# Patient Record
Sex: Female | Born: 1990 | Race: Black or African American | Hispanic: No | Marital: Single | Smoking: Current every day smoker
Health system: Southern US, Community
[De-identification: ages and names within clinical notes are randomized; demographics above are authoritative.]

## PROBLEM LIST (undated history)

## (undated) ENCOUNTER — Inpatient Hospital Stay (HOSPITAL_COMMUNITY): Payer: Self-pay

## (undated) DIAGNOSIS — Z789 Other specified health status: Secondary | ICD-10-CM

## (undated) DIAGNOSIS — S42309A Unspecified fracture of shaft of humerus, unspecified arm, initial encounter for closed fracture: Secondary | ICD-10-CM

## (undated) DIAGNOSIS — D649 Anemia, unspecified: Secondary | ICD-10-CM

## (undated) DIAGNOSIS — IMO0002 Reserved for concepts with insufficient information to code with codable children: Secondary | ICD-10-CM

## (undated) DIAGNOSIS — T8859XA Other complications of anesthesia, initial encounter: Secondary | ICD-10-CM

## (undated) DIAGNOSIS — R87619 Unspecified abnormal cytological findings in specimens from cervix uteri: Secondary | ICD-10-CM

## (undated) DIAGNOSIS — D219 Benign neoplasm of connective and other soft tissue, unspecified: Secondary | ICD-10-CM

## (undated) HISTORY — DX: Unspecified fracture of shaft of humerus, unspecified arm, initial encounter for closed fracture: S42.309A

## (undated) HISTORY — DX: Reserved for concepts with insufficient information to code with codable children: IMO0002

## (undated) HISTORY — DX: Unspecified abnormal cytological findings in specimens from cervix uteri: R87.619

---
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
## 2011-01-22 NOTE — Telephone Encounter (Signed)
Preadmission screen  

## 2011-01-31 ENCOUNTER — Other Ambulatory Visit: Payer: Self-pay | Admitting: Obstetrics and Gynecology

## 2011-06-09 ENCOUNTER — Encounter (HOSPITAL_COMMUNITY): Payer: Self-pay | Admitting: Emergency Medicine

## 2011-06-09 ENCOUNTER — Emergency Department (HOSPITAL_COMMUNITY)
Admission: EM | Admit: 2011-06-09 | Discharge: 2011-06-09 | Disposition: A | Discharge status: Home / Self Care | Payer: Self-pay | Attending: Emergency Medicine | Admitting: Emergency Medicine

## 2011-06-09 DIAGNOSIS — T39314A Poisoning by propionic acid derivatives, undetermined, initial encounter: Secondary | ICD-10-CM | POA: Insufficient documentation

## 2011-06-09 DIAGNOSIS — F411 Generalized anxiety disorder: Secondary | ICD-10-CM | POA: Insufficient documentation

## 2011-06-09 DIAGNOSIS — F329 Major depressive disorder, single episode, unspecified: Secondary | ICD-10-CM

## 2011-06-09 DIAGNOSIS — F3289 Other specified depressive episodes: Secondary | ICD-10-CM | POA: Insufficient documentation

## 2011-06-09 DIAGNOSIS — T394X2A Poisoning by antirheumatics, not elsewhere classified, intentional self-harm, initial encounter: Secondary | ICD-10-CM | POA: Insufficient documentation

## 2011-06-09 DIAGNOSIS — IMO0002 Reserved for concepts with insufficient information to code with codable children: Secondary | ICD-10-CM | POA: Insufficient documentation

## 2011-06-09 LAB — CBC
Platelets: 321 10*3/uL (ref 150–400)
RBC: 4.4 MIL/uL (ref 3.87–5.11)
RDW: 14.2 % (ref 11.5–15.5)
WBC: 8.6 10*3/uL (ref 4.0–10.5)

## 2011-06-09 LAB — RAPID URINE DRUG SCREEN, HOSP PERFORMED
Amphetamines: NOT DETECTED
Benzodiazepines: NOT DETECTED
Opiates: NOT DETECTED
Tetrahydrocannabinol: POSITIVE — AB

## 2011-06-09 LAB — COMPREHENSIVE METABOLIC PANEL
ALT: 20 U/L (ref 0–35)
AST: 17 U/L (ref 0–37)
Albumin: 4.2 g/dL (ref 3.5–5.2)
CO2: 27 mEq/L (ref 19–32)
Calcium: 9.6 mg/dL (ref 8.4–10.5)
Chloride: 102 mEq/L (ref 96–112)
Creatinine, Ser: 0.89 mg/dL (ref 0.50–1.10)
GFR calc non Af Amer: >90 mL/min (ref >90)
Sodium: 139 mEq/L (ref 135–145)
Total Bilirubin: 0.3 mg/dL (ref 0.3–1.2)

## 2011-06-09 LAB — SALICYLATE LEVEL: Salicylate Lvl: <2 mg/dL — ABNORMAL LOW (ref 2.8–20.0)

## 2011-06-09 LAB — ACETAMINOPHEN LEVEL: Acetaminophen (Tylenol), Serum: <15 ug/mL (ref 10–30)

## 2011-06-09 MED ORDER — ALUM & MAG HYDROXIDE-SIMETH 200-200-20 MG/5ML PO SUSP: 30.0000 mL | ORAL | Status: DC | PRN

## 2011-06-09 MED ORDER — ONDANSETRON HCL 4 MG PO TABS: 4.0000 mg | ORAL_TABLET | Freq: Three times a day (TID) | ORAL | Status: DC | PRN

## 2011-06-09 MED ORDER — IBUPROFEN 600 MG PO TABS: 600.0000 mg | ORAL_TABLET | Freq: Three times a day (TID) | ORAL | Status: DC | PRN

## 2011-06-09 MED ORDER — LORAZEPAM 1 MG PO TABS: 1.0000 mg | ORAL_TABLET | Freq: Three times a day (TID) | ORAL | Status: DC | PRN

## 2011-06-09 MED ORDER — ACETAMINOPHEN 325 MG PO TABS: 650.0000 mg | ORAL_TABLET | ORAL | Status: DC | PRN

## 2011-06-09 MED ORDER — NICOTINE 21 MG/24HR TD PT24
21.0000 mg | MEDICATED_PATCH | Freq: Every day | TRANSDERMAL | Status: DC
  Administered 2011-06-09: 21 mg via TRANSDERMAL
  Filled 2011-06-09: qty 1

## 2011-06-09 MED ORDER — ZOLPIDEM TARTRATE 5 MG PO TABS: 5.0000 mg | ORAL_TABLET | Freq: Every evening | ORAL | Status: DC | PRN

## 2011-06-09 NOTE — ED Notes (Signed)
Pt discharged home with sister. She denies SI/HI or A/VH. Discharge instructions reviewed. Verbalizes understanding.

## 2011-06-09 NOTE — ED Notes (Signed)
Per [facility], pt has been accepted for inpatient treatment. Writer informed [facility] of pt pending telepsych to determine plan of disposition. Per Elijah Birk, [facility] will hold bed but requests to be notified of telepsych results as soon as possible. CSW will notify pt's RN.

## 2011-06-09 NOTE — ED Notes (Signed)
Per EMS, pt took 15 200mg  Ibuprofen this morning.  Family called EMS.  Pt denies SI, pt states she doesn't know why she took them, she just has a lot going on and decided to take them and then shut herself in a closet.

## 2011-06-09 NOTE — ED Notes (Signed)
Pt reports taking 15 advil tablets- 200mg  around 9AM. Pt denies ETOH or drug use.
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
## 2011-06-09 NOTE — ED Notes (Signed)
Pt admitted to psych ED after overdose on ibuprofen. She states she "took a few pills" that were outdated to make herself feel numb. States she has felt upset by being a mother to a 41-month-old and having relationship problems with the baby's father. States she was not ever suicidal but admits she may benefit by supportive counseling. Denies HI or A/V H. Denies SA except for MJ. States she has smoked MJ since age 21 and smokes it about every other day. Unit policies reviewed. Pt. Verbalized understanding. Will cont. To monitor.

## 2011-06-09 NOTE — ED Notes (Signed)
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 Pt reported that she has an exam on Monday and is concerned about missing it if she goes inpatient. CSW will rely pt's concerns to MD for further consultation.

## 2011-06-09 NOTE — ED Notes (Addendum)
Telepsych report given to Dr. Jeraldine Loots. Bobby with [facility] notified of results.

## 2011-06-09 NOTE — ED Notes (Signed)
Poison control RN Deeksha called. Labs reviewed with her. Closed pt from their file.

## 2011-06-09 NOTE — Discharge Instructions (Signed)
Please be sure to take advantage of the resources provided to you by our behavioral health team.  If you develop any new, or concerning changes in your condition, please return to emergency department immediately
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
## 2011-06-09 NOTE — ED Notes (Signed)
Per Dr. Gary Fleet, the patient is appropriate for d/c w outpatient f/u.  She was provided resources and d/c in stable condition.  Gerhard Munch, MD 06/09/11 [phone]

## 2011-06-09 NOTE — ED Notes (Signed)
MD recommeds Telepsych to determine if pt can released with follow up care oppose to inpatient. RN notified.

## 2011-06-09 NOTE — ED Notes (Signed)
Poison controlled notified.  They suggested getting tylenol level and BUN/Creatinine level. They suggested the patient may have some mild GI upset.  After labs returned and if normal pt is cleared for behavioral health- Talked with [facility]

## 2012-02-13 HISTORY — DX: Maternal care for unspecified type scar from previous cesarean delivery: O34.219

## 2012-04-03 ENCOUNTER — Encounter (HOSPITAL_COMMUNITY): Payer: Self-pay

## 2012-04-03 ENCOUNTER — Inpatient Hospital Stay (HOSPITAL_COMMUNITY)
Admission: AD | Admit: 2012-04-03 | Discharge: 2012-04-03 | Disposition: A | Discharge status: Home / Self Care | Payer: Medicaid Other | Source: Ambulatory Visit | Attending: Obstetrics & Gynecology | Admitting: Obstetrics & Gynecology

## 2012-04-03 ENCOUNTER — Inpatient Hospital Stay (HOSPITAL_COMMUNITY): Payer: Medicaid Other

## 2012-04-03 DIAGNOSIS — O0932 Supervision of pregnancy with insufficient antenatal care, second trimester: Secondary | ICD-10-CM

## 2012-04-03 DIAGNOSIS — O99891 Other specified diseases and conditions complicating pregnancy: Secondary | ICD-10-CM

## 2012-04-03 DIAGNOSIS — O0930 Supervision of pregnancy with insufficient antenatal care, unspecified trimester: Secondary | ICD-10-CM

## 2012-04-03 DIAGNOSIS — O09892 Supervision of other high risk pregnancies, second trimester: Secondary | ICD-10-CM

## 2012-04-03 MED ORDER — PRENATAL PLUS 27-1 MG PO TABS: 1.0000 | ORAL_TABLET | Freq: Every day | ORAL | Status: DC
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
## 2012-04-03 NOTE — MAU Note (Signed)
Here for pregnancy verification letter only, denies pain or bleeding.

## 2012-05-29 ENCOUNTER — Other Ambulatory Visit: Payer: Self-pay

## 2012-05-29 DIAGNOSIS — Z3201 Encounter for pregnancy test, result positive: Secondary | ICD-10-CM

## 2012-05-30 LAB — OBSTETRIC PANEL
Antibody Screen: NEGATIVE
Basophils Absolute: 0 10*3/uL (ref 0.0–0.1)
Basophils Relative: 0 % (ref 0–1)
Eosinophils Absolute: 0.1 10*3/uL (ref 0.0–0.7)
Eosinophils Relative: 2 % (ref 0–5)
HCT: 30.8 % — ABNORMAL LOW (ref 36.0–46.0)
Hemoglobin: 10.5 g/dL — ABNORMAL LOW (ref 12.0–15.0)
MCH: 31.1 pg (ref 26.0–34.0)
MCHC: 34.1 g/dL (ref 30.0–36.0)
Monocytes Absolute: 0.4 10*3/uL (ref 0.1–1.0)
Monocytes Relative: 5 % (ref 3–12)
RDW: 13.7 % (ref 11.5–15.5)
Rh Type: POSITIVE

## 2012-05-30 LAB — HIV ANTIBODY (ROUTINE TESTING W REFLEX): HIV: NONREACTIVE

## 2012-06-02 LAB — HEMOGLOBINOPATHY EVALUATION: Hgb S Quant: 0 %

## 2012-06-03 ENCOUNTER — Telehealth: Payer: Self-pay | Admitting: *Deleted

## 2012-06-03 NOTE — Telephone Encounter (Addendum)
Message copied by Jill Side on Tue Jun 03, 2012 12:18 PM ------      Message from: Willodean Rosenthal      Created: Tue Jun 03, 2012  8:31 AM       Please call pt.  She needs to begin FeSO4 OTC bid and make sure she is taking her PNV.            Thx,      clh-S    ------ Called pt and mother answered. She stated that she will give Leily the message to call us back.  4/23  1210 - called pt and informed her of need for supplemental iron tablet in addition to her prenatal vitamin. Pt states that she did not receive PNV Rx. I advised that I will resend to her pharmacy. Pt voiced understanding.

## 2012-06-04 MED ORDER — FERROUS SULFATE 325 (65 FE) MG PO TABS: 325.0000 mg | ORAL_TABLET | Freq: Two times a day (BID) | ORAL | Status: DC

## 2012-06-04 MED ORDER — PRENATAL PLUS 27-1 MG PO TABS: 1.0000 | ORAL_TABLET | Freq: Every day | ORAL | Status: DC

## 2012-06-09 ENCOUNTER — Inpatient Hospital Stay (HOSPITAL_COMMUNITY): Payer: Medicaid Other

## 2012-06-09 ENCOUNTER — Inpatient Hospital Stay (HOSPITAL_COMMUNITY)
Admission: EM | Admit: 2012-06-09 | Discharge: 2012-06-09 | Disposition: A | Discharge status: Hospice | Payer: Medicaid Other | Attending: Obstetrics and Gynecology | Admitting: Obstetrics and Gynecology

## 2012-06-09 ENCOUNTER — Encounter (HOSPITAL_COMMUNITY): Payer: Self-pay | Admitting: Emergency Medicine

## 2012-06-09 DIAGNOSIS — S3991XA Unspecified injury of abdomen, initial encounter: Secondary | ICD-10-CM

## 2012-06-09 DIAGNOSIS — R109 Unspecified abdominal pain: Secondary | ICD-10-CM | POA: Insufficient documentation

## 2012-06-09 DIAGNOSIS — W19XXXA Unspecified fall, initial encounter: Secondary | ICD-10-CM

## 2012-06-09 DIAGNOSIS — O0930 Supervision of pregnancy with insufficient antenatal care, unspecified trimester: Secondary | ICD-10-CM | POA: Insufficient documentation

## 2012-06-09 DIAGNOSIS — R1084 Generalized abdominal pain: Secondary | ICD-10-CM

## 2012-06-09 DIAGNOSIS — Y92009 Unspecified place in unspecified non-institutional (private) residence as the place of occurrence of the external cause: Secondary | ICD-10-CM

## 2012-06-09 DIAGNOSIS — O4693 Antepartum hemorrhage, unspecified, third trimester: Secondary | ICD-10-CM

## 2012-06-09 DIAGNOSIS — R000 Tachycardia, unspecified: Secondary | ICD-10-CM | POA: Insufficient documentation

## 2012-06-09 DIAGNOSIS — O4690 Antepartum hemorrhage, unspecified, unspecified trimester: Secondary | ICD-10-CM | POA: Insufficient documentation

## 2012-06-09 DIAGNOSIS — Y929 Unspecified place or not applicable: Secondary | ICD-10-CM | POA: Insufficient documentation

## 2012-06-09 DIAGNOSIS — W1809XA Striking against other object with subsequent fall, initial encounter: Secondary | ICD-10-CM | POA: Insufficient documentation

## 2012-06-09 LAB — KLEIHAUER-BETKE STAIN: Quantitation Fetal Hemoglobin: 0 mL
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
## 2012-06-09 NOTE — Progress Notes (Signed)
Octavio Graves, RN at [facility] notified of pt transfer and Dr. Jolayne Panther acceptance. Report given.
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
## 2012-06-09 NOTE — Progress Notes (Signed)
Called for pt who is pregnant and fell. OB RR RN in route
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
## 2012-06-09 NOTE — ED Notes (Signed)
Rapid OB nurse at the bedside. Pt placed on fetal heart monitor
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
## 2012-06-11 NOTE — MAU Provider Note (Signed)
Attestation of Attending Supervision of Advanced Practitioner (CNM/NP): Evaluation and management procedures were performed by the Advanced Practitioner under my supervision and collaboration.  I have reviewed the Advanced Practitioner's note and chart, and I agree with the management and plan.  Hasan Douse 06/11/2012 8:51 AM   

## 2012-06-16 ENCOUNTER — Encounter: Payer: Self-pay | Admitting: Advanced Practice Midwife

## 2012-06-16 ENCOUNTER — Ambulatory Visit (INDEPENDENT_AMBULATORY_CARE_PROVIDER_SITE_OTHER): Payer: Self-pay | Admitting: Advanced Practice Midwife

## 2012-06-16 VITALS — BP 122/75 | Temp 97.0°F | Wt 194.6 lb

## 2012-06-16 DIAGNOSIS — Z915 Personal history of self-harm: Secondary | ICD-10-CM

## 2012-06-16 DIAGNOSIS — O0933 Supervision of pregnancy with insufficient antenatal care, third trimester: Secondary | ICD-10-CM

## 2012-06-16 DIAGNOSIS — T7491XA Unspecified adult maltreatment, confirmed, initial encounter: Secondary | ICD-10-CM | POA: Insufficient documentation

## 2012-06-16 DIAGNOSIS — Z8659 Personal history of other mental and behavioral disorders: Secondary | ICD-10-CM

## 2012-06-16 DIAGNOSIS — O0930 Supervision of pregnancy with insufficient antenatal care, unspecified trimester: Secondary | ICD-10-CM

## 2012-06-16 DIAGNOSIS — O34219 Maternal care for unspecified type scar from previous cesarean delivery: Secondary | ICD-10-CM

## 2012-06-16 DIAGNOSIS — F1210 Cannabis abuse, uncomplicated: Secondary | ICD-10-CM

## 2012-06-16 DIAGNOSIS — Z9151 Personal history of suicidal behavior: Secondary | ICD-10-CM

## 2012-06-16 DIAGNOSIS — R6889 Other general symptoms and signs: Secondary | ICD-10-CM

## 2012-06-16 DIAGNOSIS — IMO0002 Reserved for concepts with insufficient information to code with codable children: Secondary | ICD-10-CM | POA: Insufficient documentation

## 2012-06-16 HISTORY — DX: Supervision of pregnancy with insufficient antenatal care, unspecified trimester: O09.30

## 2012-06-16 LAB — POCT URINALYSIS DIP (DEVICE)
Glucose, UA: NEGATIVE mg/dL
Nitrite: NEGATIVE
Specific Gravity, Urine: 1.02 (ref 1.005–1.030)
Urobilinogen, UA: 1 mg/dL (ref 0.0–1.0)

## 2012-06-16 MED ORDER — PRENATAL VITAMINS 28-0.8 MG PO TABS: 1.0000 | ORAL_TABLET | Freq: Every day | ORAL | Status: DC
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
## 2012-06-16 NOTE — Progress Notes (Signed)
Nutrition note: 1st visit consult Pt has gained 21.6# @ [redacted]w[redacted]d, which is wnl. Pt reports eating 2-3 meals & 6 snacks/d. Pt is not taking PNV yet but plans to get some today. Pt reports no N&V but has some heartburn. Pt received verbal & written education on general nutrition during pregnancy. Disc tips to decrease heartburn. Disc wt gain goals of 15-25# or 0.6#/wk. Pt agrees to start taking PNV. Pt does not have WIC but plans to apply. Pt plans to BF. F/u if referred Blondell Reveal, MS, RD, LDN
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
## 2012-06-17 LAB — CULTURE, OB URINE

## 2012-06-17 LAB — WET PREP, GENITAL: Trich, Wet Prep: NONE SEEN

## 2012-06-18 ENCOUNTER — Other Ambulatory Visit: Payer: Medicaid Other

## 2012-06-18 DIAGNOSIS — O0930 Supervision of pregnancy with insufficient antenatal care, unspecified trimester: Secondary | ICD-10-CM

## 2012-06-30 ENCOUNTER — Encounter: Payer: Self-pay | Admitting: Obstetrics & Gynecology

## 2012-07-03 ENCOUNTER — Encounter: Payer: Self-pay | Admitting: *Deleted

## 2012-07-14 ENCOUNTER — Encounter: Payer: Self-pay | Admitting: Family Medicine

## 2012-07-14 ENCOUNTER — Encounter (HOSPITAL_COMMUNITY): Payer: Self-pay | Admitting: *Deleted

## 2012-07-14 ENCOUNTER — Ambulatory Visit (INDEPENDENT_AMBULATORY_CARE_PROVIDER_SITE_OTHER): Payer: Medicaid Other | Admitting: Family Medicine

## 2012-07-14 VITALS — BP 119/79 | Wt 198.1 lb

## 2012-07-14 DIAGNOSIS — O0930 Supervision of pregnancy with insufficient antenatal care, unspecified trimester: Secondary | ICD-10-CM

## 2012-07-14 DIAGNOSIS — O34219 Maternal care for unspecified type scar from previous cesarean delivery: Secondary | ICD-10-CM

## 2012-07-14 DIAGNOSIS — O0933 Supervision of pregnancy with insufficient antenatal care, third trimester: Secondary | ICD-10-CM

## 2012-07-14 LAB — POCT URINALYSIS DIP (DEVICE)
Ketones, ur: NEGATIVE mg/dL
Nitrite: NEGATIVE
Protein, ur: 30 mg/dL — AB
pH: 7 (ref 5.0–8.0)

## 2012-07-14 MED ORDER — TETANUS-DIPHTH-ACELL PERTUSSIS 5-2.5-18.5 LF-MCG/0.5 IM SUSP
0.5000 mL | Freq: Once | INTRAMUSCULAR | Status: AC
  Administered 2012-07-14: 0.5 mL via INTRAMUSCULAR

## 2012-07-14 NOTE — Progress Notes (Signed)
Desires [facility]--not yet scheduled.  Limited [facility], not here since 34 wks. Feeling pressure.

## 2012-07-14 NOTE — Progress Notes (Signed)
Pulse: 81

## 2012-07-14 NOTE — Patient Instructions (Signed)
Contraception Choices Contraception (birth control) is the use of any methods or devices to prevent pregnancy. Below are some methods to help avoid pregnancy. HORMONAL METHODS   Contraceptive implant. This is a thin, plastic tube containing progesterone hormone. It does not contain estrogen hormone. Your caregiver inserts the tube in the inner part of the upper arm. The tube can remain in place for up to 3 years. After 3 years, the implant must be removed. The implant prevents the ovaries from releasing an egg (ovulation), thickens the cervical mucus which prevents sperm from entering the uterus, and thins the lining of the inside of the uterus.  Progesterone-only injections. These injections are given every 3 months by your caregiver to prevent pregnancy. This synthetic progesterone hormone stops the ovaries from releasing eggs. It also thickens cervical mucus and changes the uterine lining. This makes it harder for sperm to survive in the uterus.  Birth control pills. These pills contain estrogen and progesterone hormone. They work by stopping the egg from forming in the ovary (ovulation). Birth control pills are prescribed by a caregiver.Birth control pills can also be used to treat heavy periods.  Minipill. This type of birth control pill contains only the progesterone hormone. They are taken every day of each month and must be prescribed by your caregiver.  Birth control patch. The patch contains hormones similar to those in birth control pills. It must be changed once a week and is prescribed by a caregiver.  Vaginal ring. The ring contains hormones similar to those in birth control pills. It is left in the vagina for 3 weeks, removed for 1 week, and then a new one is put back in place. The patient must be comfortable inserting and removing the ring from the vagina.A caregiver's prescription is necessary.  Emergency contraception. Emergency contraceptives prevent pregnancy after unprotected  sexual intercourse. This pill can be taken right after sex or up to 5 days after unprotected sex. It is most effective the sooner you take the pills after having sexual intercourse. Emergency contraceptive pills are available without a prescription. Check with your pharmacist. Do not use emergency contraception as your only form of birth control. BARRIER METHODS   Female condom. This is a thin sheath (latex or rubber) that is worn over the penis during sexual intercourse. It can be used with spermicide to increase effectiveness.  Female condom. This is a soft, loose-fitting sheath that is put into the vagina before sexual intercourse.  Diaphragm. This is a soft, latex, dome-shaped barrier that must be fitted by a caregiver. It is inserted into the vagina, along with a spermicidal jelly. It is inserted before intercourse. The diaphragm should be left in the vagina for 6 to 8 hours after intercourse.  Cervical cap. This is a round, soft, latex or plastic cup that fits over the cervix and must be fitted by a caregiver. The cap can be left in place for up to 48 hours after intercourse.  Sponge. This is a soft, circular piece of polyurethane foam. The sponge has spermicide in it. It is inserted into the vagina after wetting it and before sexual intercourse.  Spermicides. These are chemicals that kill or block sperm from entering the cervix and uterus. They come in the form of creams, jellies, suppositories, foam, or tablets. They do not require a prescription. They are inserted into the vagina with an applicator before having sexual intercourse. The process must be repeated every time you have sexual intercourse. INTRAUTERINE CONTRACEPTION  Intrauterine device (  IUD). This is a T-shaped device that is put in a woman's uterus during a menstrual period to prevent pregnancy. There are 2 types:  Copper IUD. This type of IUD is wrapped in copper wire and is placed inside the uterus. Copper makes the uterus and  fallopian tubes produce a fluid that kills sperm. It can stay in place for 10 years.  Hormone IUD. This type of IUD contains the hormone progestin (synthetic progesterone). The hormone thickens the cervical mucus and prevents sperm from entering the uterus, and it also thins the uterine lining to prevent implantation of a fertilized egg. The hormone can weaken or kill the sperm that get into the uterus. It can stay in place for 5 years. PERMANENT METHODS OF CONTRACEPTION  Female tubal ligation. This is when the woman's fallopian tubes are surgically sealed, tied, or blocked to prevent the egg from traveling to the uterus.  Female sterilization. This is when the female has the tubes that carry sperm tied off (vasectomy).This blocks sperm from entering the vagina during sexual intercourse. After the procedure, the man can still ejaculate fluid (semen). NATURAL PLANNING METHODS  Natural family planning. This is not having sexual intercourse or using a barrier method (condom, diaphragm, cervical cap) on days the woman could become pregnant.  Calendar method. This is keeping track of the length of each menstrual cycle and identifying when you are fertile.  Ovulation method. This is avoiding sexual intercourse during ovulation.  Symptothermal method. This is avoiding sexual intercourse during ovulation, using a thermometer and ovulation symptoms.  Post-ovulation method. This is timing sexual intercourse after you have ovulated. Regardless of which type or method of contraception you choose, it is important that you use condoms to protect against the transmission of sexually transmitted diseases (STDs). Talk with your caregiver about which form of contraception is most appropriate for you. Document Released: 01/29/2005 Document Revised: 04/23/2011 Document Reviewed: 06/07/2010 [employer] Patient Information 2014 [employer].  Breastfeeding A change in hormones during your pregnancy causes growth of  your breast tissue and an increase in number and size of milk ducts. The hormone prolactin allows proteins, sugars, and fats from your blood supply to make breast milk in your milk-producing glands. The hormone progesterone prevents breast milk from being released before the birth of your baby. After the birth of your baby, your progesterone level decreases allowing breast milk to be released. Thoughts of your baby, as well as his or her sucking or crying, can stimulate the release of milk from the milk-producing glands. Deciding to breastfeed (nurse) is one of the best choices you can make for you and your baby. The information that follows gives a brief review of the benefits, as well as other important skills to know about breastfeeding. BENEFITS OF BREASTFEEDING For your baby  The first milk (colostrum) helps your baby's digestive system function better.   There are antibodies in your milk that help your baby fight off infections.   Your baby has a lower incidence of asthma, allergies, and sudden infant death syndrome (SIDS).   The nutrients in breast milk are better for your baby than infant formulas.  Breast milk improves your baby's brain development.   Your baby will have less gas, colic, and constipation.  Your baby is less likely to develop other conditions, such as childhood obesity, asthma, or diabetes mellitus. For you  Breastfeeding helps develop a very special bond between you and your baby.   Breastfeeding is convenient, always available at the correct   temperature, and costs nothing.   Breastfeeding helps to burn calories and helps you lose the weight gained during pregnancy.   Breastfeeding makes your uterus contract back down to normal size faster and slows bleeding following delivery.   Breastfeeding mothers have a lower risk of developing osteoporosis or breast or ovarian cancer later in life.  BREASTFEEDING FREQUENCY  A healthy, full-term baby may  breastfeed as often as every hour or space his or her feedings to every 3 hours. Breastfeeding frequency will vary from baby to baby.   Newborns should be fed no less than every 2 3 hours during the day and every 4 5 hours during the night. You should breastfeed a minimum of 8 feedings in a 24 hour period.  Awaken your baby to breastfeed if it has been 3 4 hours since the last feeding.  Breastfeed when you feel the need to reduce the fullness of your breasts or when your newborn shows signs of hunger. Signs that your baby may be hungry include:  Increased alertness or activity.  Stretching.  Movement of the head from side to side.  Movement of the head and opening of the mouth when the corner of the mouth or cheek is stroked (rooting).  Increased sucking sounds, smacking lips, cooing, sighing, or squeaking.  Hand-to-mouth movements.  Increased sucking of fingers or hands.  Fussing.  Intermittent crying.  Signs of extreme hunger will require calming and consoling before you try to feed your baby. Signs of extreme hunger may include:  Restlessness.  A loud, strong cry.  Screaming.  Frequent feeding will help you make more milk and will help prevent problems, such as sore nipples and engorgement of the breasts.  BREASTFEEDING   Whether lying down or sitting, be sure that the baby's abdomen is facing your abdomen.   Support your breast with 4 fingers under your breast and your thumb above your nipple. Make sure your fingers are well away from your nipple and your baby's mouth.   Stroke your baby's lips gently with your finger or nipple.   When your baby's mouth is open wide enough, place all of your nipple and as much of the colored area around your nipple (areola) as possible into your baby's mouth.  More areola should be visible above his or her upper lip than below his or her lower lip.  Your baby's tongue should be between his or her lower gum and your  breast.  Ensure that your baby's mouth is correctly positioned around the nipple (latched). Your baby's lips should create a seal on your breast.  Signs that your baby has effectively latched onto your nipple include:  Tugging or sucking without pain.  Swallowing heard between sucks.  Absent click or smacking sound.  Muscle movement above and in front of his or her ears with sucking.  Your baby must suck about 2 3 minutes in order to get your milk. Allow your baby to feed on each breast as long as he or she wants. Nurse your baby until he or she unlatches or falls asleep at the first breast, then offer the second breast.  Signs that your baby is full and satisfied include:  A gradual decrease in the number of sucks or complete cessation of sucking.  Falling asleep.  Extension or relaxation of his or her body.  Retention of a small amount of milk in his or her mouth.  Letting go of your breast by himself or herself.  Signs of effective   breastfeeding in you include:  Breasts that have increased firmness, weight, and size prior to feeding.  Breasts that are softer after nursing.  Increased milk volume, as well as a change in milk consistency and color by the 5th day of breastfeeding.  Breast fullness relieved by breastfeeding.  Nipples are not sore, cracked, or bleeding.  If needed, break the suction by putting your finger into the corner of your baby's mouth and sliding your finger between his or her gums. Then, remove your breast from his or her mouth.  It is common for babies to spit up a small amount after a feeding.  Babies often swallow air during feeding. This can make babies fussy. Burping your baby between breasts can help with this.  Vitamin D supplements are recommended for babies who get only breast milk.  Avoid using a pacifier during your baby's first 4 6 weeks.  Avoid supplemental feedings of water, formula, or juice in place of breastfeeding. Breast milk  is all the food your baby needs. It is not necessary for your baby to have water or formula. Your breasts will make more milk if supplemental feedings are avoided during the early weeks. HOW TO TELL WHETHER YOUR BABY IS GETTING ENOUGH BREAST MILK Wondering whether or not your baby is getting enough milk is a common concern among mothers. You can be assured that your baby is getting enough milk if:   Your baby is actively sucking and you hear swallowing.   Your baby seems relaxed and satisfied after a feeding.   Your baby nurses at least 8 12 times in a 24 hour time period.  During the first 3 5 days of age:  Your baby is wetting at least 3 5 diapers in a 24 hour period. The urine should be clear and pale yellow.  Your baby is having at least 3 4 stools in a 24 hour period. The stool should be soft and yellow.  At 5 7 days of age, your baby is having at least 3 6 stools in a 24 hour period. The stool should be seedy and yellow by 5 days of age.  Your baby has a weight loss less than 7 10% during the first 3 days of age.  Your baby does not lose weight after 3 7 days of age.  Your baby gains 4 7 ounces each week after he or she is 4 days of age.  Your baby gains weight by 5 days of age and is back to birth weight within 2 weeks. ENGORGEMENT In the first week after your baby is born, you may experience extremely full breasts (engorgement). When engorged, your breasts may feel heavy, warm, or tender to the touch. Engorgement peaks within 24 48 hours after delivery of your baby.  Engorgement may be reduced by:  Continuing to breastfeed.  Increasing the frequency of breastfeeding.  Taking warm showers or applying warm, moist heat to your breasts just before each feeding. This increases circulation and helps the milk flow.   Gently massaging your breast before and during the feedings. With your fingertips, massage from your chest wall towards your nipple in a circular motion.    Ensuring that your baby empties at least one breast at every feeding. It also helps to start the next feeding on the opposite breast.   Expressing breast milk by hand or by using a breast pump to empty the breasts if your baby is sleepy, or not nursing well. You may also want to   express milk if you are returning to work oryou feel you are getting engorged.  Ensuring your baby is latched on and positioned properly while breastfeeding. If you follow these suggestions, your engorgement should improve in 24 48 hours. If you are still experiencing difficulty, call your lactation consultant or caregiver.  CARING FOR YOURSELF Take care of your breasts.  Bathe or shower daily.   Avoid using soap on your nipples.   Wear a supportive bra. Avoid wearing underwire style bras.  Air dry your nipples for a 3 4minutes after each feeding.   Use only cotton bra pads to absorb breast milk leakage. Leaking of breast milk between feedings is normal.   Use only pure lanolin on your nipples after nursing. You do not need to wash it off before feeding your baby again. Another option is to express a few drops of breast milk and gently massage that milk into your nipples.  Continue breast self-awareness checks. Take care of yourself.  Eat healthy foods. Alternate 3 meals with 3 snacks.  Avoid foods that you notice affect your baby in a bad way.  Drink milk, fruit juice, and water to satisfy your thirst (about 8 glasses a day).   Rest often, relax, and take your prenatal vitamins to prevent fatigue, stress, and anemia.  Avoid chewing and smoking tobacco.  Avoid alcohol and drug use.  Take over-the-counter and prescribed medicine only as directed by your caregiver or pharmacist. You should always check with your caregiver or pharmacist before taking any new medicine, vitamin, or herbal supplement.  Know that pregnancy is possible while breastfeeding. If desired, talk to your caregiver about  family planning and safe birth control methods that may be used while breastfeeding. SEEK MEDICAL CARE IF:   You feel like you want to stop breastfeeding or have become frustrated with breastfeeding.  You have painful breasts or nipples.  Your nipples are cracked or bleeding.  Your breasts are red, tender, or warm.  You have a swollen area on either breast.  You have a fever or chills.  You have nausea or vomiting.  You have drainage from your nipples.  Your breasts do not become full before feedings by the 5th day after delivery.  You feel sad and depressed.  Your baby is too sleepy to eat well.  Your baby is having trouble sleeping.   Your baby is wetting less than 3 diapers in a 24 hour period.  Your baby has less than 3 stools in a 24 hour period.  Your baby's skin or the white part of his or her eyes becomes more yellow.   Your baby is not gaining weight by 5 days of age. MAKE SURE YOU:   Understand these instructions.  Will watch your condition.  Will get help right away if you are not doing well or get worse. Document Released: 01/29/2005 Document Revised: 10/24/2011 Document Reviewed: 09/05/2011 [employer] Patient Information 2014 [employer].  

## 2012-07-15 ENCOUNTER — Encounter (HOSPITAL_COMMUNITY): Payer: Self-pay | Admitting: Anesthesiology

## 2012-07-15 ENCOUNTER — Inpatient Hospital Stay (HOSPITAL_COMMUNITY): Payer: Medicaid Other | Admitting: Anesthesiology

## 2012-07-15 ENCOUNTER — Encounter (HOSPITAL_COMMUNITY)
Admission: RE | Disposition: A | Discharge status: Home / Self Care | Payer: Self-pay | Source: Ambulatory Visit | Attending: Obstetrics & Gynecology

## 2012-07-15 ENCOUNTER — Inpatient Hospital Stay (HOSPITAL_COMMUNITY)
Admission: RE | Admit: 2012-07-15 | Discharge: 2012-07-17 | DRG: 766 | Disposition: A | Discharge status: Home / Self Care | Payer: Medicaid Other | Source: Ambulatory Visit | Attending: Obstetrics & Gynecology | Admitting: Obstetrics & Gynecology

## 2012-07-15 DIAGNOSIS — IMO0002 Reserved for concepts with insufficient information to code with codable children: Secondary | ICD-10-CM

## 2012-07-15 DIAGNOSIS — O0933 Supervision of pregnancy with insufficient antenatal care, third trimester: Secondary | ICD-10-CM

## 2012-07-15 DIAGNOSIS — O34219 Maternal care for unspecified type scar from previous cesarean delivery: Principal | ICD-10-CM | POA: Diagnosis present

## 2012-07-15 HISTORY — DX: Anemia, unspecified: D64.9

## 2012-07-15 HISTORY — DX: Other specified health status: Z78.9

## 2012-07-15 LAB — CBC
HCT: 34 % — ABNORMAL LOW (ref 36.0–46.0)
MCV: 92.9 fL (ref 78.0–100.0)
Platelets: 164 10*3/uL (ref 150–400)
RBC: 3.66 MIL/uL — ABNORMAL LOW (ref 3.87–5.11)
RDW: 13.6 % (ref 11.5–15.5)
WBC: 9.8 10*3/uL (ref 4.0–10.5)

## 2012-07-15 LAB — DRUG SCREEN, URINE
Amphetamine Screen, Ur: NEGATIVE
Barbiturate Quant, Ur: NEGATIVE
Marijuana Metabolite: NEGATIVE
Methadone: NEGATIVE
Propoxyphene: NEGATIVE

## 2012-07-15 LAB — TYPE AND SCREEN: Antibody Screen: NEGATIVE

## 2012-07-15 SURGERY — Surgical Case
Anesthesia: Spinal | Site: Abdomen | Wound class: Clean Contaminated

## 2012-07-15 MED ORDER — KETOROLAC TROMETHAMINE 30 MG/ML IJ SOLN: 30.0000 mg | Freq: Four times a day (QID) | INTRAMUSCULAR | Status: AC | PRN

## 2012-07-15 MED ORDER — CEFAZOLIN SODIUM-DEXTROSE 2-3 GM-% IV SOLR
INTRAVENOUS | Status: AC
Start: 2012-07-15 — End: 2012-07-15
  Filled 2012-07-15: qty 50

## 2012-07-15 MED ORDER — LACTATED RINGERS IV SOLN
Freq: Once | INTRAVENOUS | Status: AC
  Administered 2012-07-15: 13:00:00 via INTRAVENOUS

## 2012-07-15 MED ORDER — BUPIVACAINE HCL (PF) 0.5 % IJ SOLN
INTRAMUSCULAR | Status: DC | PRN
  Administered 2012-07-15: 30 mL

## 2012-07-15 MED ORDER — MORPHINE SULFATE 0.5 MG/ML IJ SOLN
INTRAMUSCULAR | Status: AC
  Filled 2012-07-15: qty 10

## 2012-07-15 MED ORDER — DIPHENHYDRAMINE HCL 50 MG/ML IJ SOLN: 12.5000 mg | INTRAMUSCULAR | Status: DC | PRN

## 2012-07-15 MED ORDER — ZOLPIDEM TARTRATE 5 MG PO TABS: 5.0000 mg | ORAL_TABLET | Freq: Every evening | ORAL | Status: DC | PRN

## 2012-07-15 MED ORDER — SENNOSIDES-DOCUSATE SODIUM 8.6-50 MG PO TABS
2.0000 | ORAL_TABLET | Freq: Every evening | ORAL | Status: DC | PRN
  Administered 2012-07-16: 2 via ORAL

## 2012-07-15 MED ORDER — SIMETHICONE 80 MG PO CHEW
80.0000 mg | CHEWABLE_TABLET | Freq: Three times a day (TID) | ORAL | Status: DC
  Administered 2012-07-15 – 2012-07-17 (×6): 80 mg via ORAL

## 2012-07-15 MED ORDER — SCOPOLAMINE 1 MG/3DAYS TD PT72: 1.0000 | MEDICATED_PATCH | Freq: Once | TRANSDERMAL | Status: DC

## 2012-07-15 MED ORDER — SIMETHICONE 80 MG PO CHEW: 80.0000 mg | CHEWABLE_TABLET | ORAL | Status: DC | PRN

## 2012-07-15 MED ORDER — DIPHENHYDRAMINE HCL 25 MG PO CAPS: 25.0000 mg | ORAL_CAPSULE | ORAL | Status: DC | PRN

## 2012-07-15 MED ORDER — MEASLES, MUMPS & RUBELLA VAC ~~LOC~~ INJ
0.5000 mL | INJECTION | Freq: Once | SUBCUTANEOUS | Status: DC
  Filled 2012-07-15: qty 0.5

## 2012-07-15 MED ORDER — LACTATED RINGERS IV SOLN
INTRAVENOUS | Status: DC
  Administered 2012-07-15: 15:00:00 via INTRAVENOUS

## 2012-07-15 MED ORDER — ACETAMINOPHEN 10 MG/ML IV SOLN
1000.0000 mg | Freq: Four times a day (QID) | INTRAVENOUS | Status: AC | PRN
  Filled 2012-07-15: qty 100

## 2012-07-15 MED ORDER — DIPHENHYDRAMINE HCL 25 MG PO CAPS: 25.0000 mg | ORAL_CAPSULE | Freq: Four times a day (QID) | ORAL | Status: DC | PRN

## 2012-07-15 MED ORDER — NALOXONE HCL 1 MG/ML IJ SOLN
1.0000 ug/kg/h | INTRAMUSCULAR | Status: DC | PRN
  Filled 2012-07-15: qty 2

## 2012-07-15 MED ORDER — BUPIVACAINE IN DEXTROSE 0.75-8.25 % IT SOLN
INTRATHECAL | Status: DC | PRN
  Administered 2012-07-15: 12 mg via INTRATHECAL

## 2012-07-15 MED ORDER — ONDANSETRON HCL 4 MG/2ML IJ SOLN: 4.0000 mg | Freq: Three times a day (TID) | INTRAMUSCULAR | Status: DC | PRN

## 2012-07-15 MED ORDER — DIPHENHYDRAMINE HCL 50 MG/ML IJ SOLN: 25.0000 mg | INTRAMUSCULAR | Status: DC | PRN

## 2012-07-15 MED ORDER — DEXAMETHASONE SODIUM PHOSPHATE 10 MG/ML IJ SOLN
INTRAMUSCULAR | Status: DC | PRN
  Administered 2012-07-15: 10 mg via INTRAVENOUS

## 2012-07-15 MED ORDER — EPHEDRINE 5 MG/ML INJ
INTRAVENOUS | Status: AC
  Filled 2012-07-15: qty 10

## 2012-07-15 MED ORDER — KETOROLAC TROMETHAMINE 30 MG/ML IJ SOLN
INTRAMUSCULAR | Status: AC
  Administered 2012-07-15: 30 mg via INTRAVENOUS
  Filled 2012-07-15: qty 1

## 2012-07-15 MED ORDER — LACTATED RINGERS IV SOLN
INTRAVENOUS | Status: DC
  Administered 2012-07-15: 22:00:00 via INTRAVENOUS

## 2012-07-15 MED ORDER — ONDANSETRON HCL 4 MG/2ML IJ SOLN
INTRAMUSCULAR | Status: DC | PRN
  Administered 2012-07-15: 4 mg via INTRAVENOUS

## 2012-07-15 MED ORDER — CEFAZOLIN SODIUM-DEXTROSE 2-3 GM-% IV SOLR
INTRAVENOUS | Status: AC
  Filled 2012-07-15: qty 50

## 2012-07-15 MED ORDER — BUPIVACAINE HCL (PF) 0.5 % IJ SOLN
INTRAMUSCULAR | Status: AC
  Filled 2012-07-15: qty 30

## 2012-07-15 MED ORDER — MIDAZOLAM HCL 2 MG/2ML IJ SOLN: 0.5000 mg | Freq: Once | INTRAMUSCULAR | Status: DC | PRN

## 2012-07-15 MED ORDER — OXYCODONE-ACETAMINOPHEN 5-325 MG PO TABS
1.0000 | ORAL_TABLET | ORAL | Status: DC | PRN
  Administered 2012-07-16 (×4): 1 via ORAL
  Administered 2012-07-17 (×2): 2 via ORAL
  Filled 2012-07-15: qty 1
  Filled 2012-07-15: qty 2
  Filled 2012-07-15: qty 1
  Filled 2012-07-15: qty 2
  Filled 2012-07-15 (×2): qty 1

## 2012-07-15 MED ORDER — METOCLOPRAMIDE HCL 5 MG/ML IJ SOLN: 10.0000 mg | Freq: Three times a day (TID) | INTRAMUSCULAR | Status: DC | PRN

## 2012-07-15 MED ORDER — WITCH HAZEL-GLYCERIN EX PADS: 1.0000 "application " | MEDICATED_PAD | CUTANEOUS | Status: DC | PRN

## 2012-07-15 MED ORDER — FENTANYL CITRATE 0.05 MG/ML IJ SOLN: 25.0000 ug | INTRAMUSCULAR | Status: DC | PRN

## 2012-07-15 MED ORDER — NALBUPHINE HCL 10 MG/ML IJ SOLN
5.0000 mg | INTRAMUSCULAR | Status: DC | PRN
  Filled 2012-07-15: qty 1

## 2012-07-15 MED ORDER — LANOLIN HYDROUS EX OINT: 1.0000 "application " | TOPICAL_OINTMENT | CUTANEOUS | Status: DC | PRN

## 2012-07-15 MED ORDER — ONDANSETRON HCL 4 MG/2ML IJ SOLN: 4.0000 mg | INTRAMUSCULAR | Status: DC | PRN

## 2012-07-15 MED ORDER — MENTHOL 3 MG MT LOZG: 1.0000 | LOZENGE | OROMUCOSAL | Status: DC | PRN

## 2012-07-15 MED ORDER — NALOXONE HCL 0.4 MG/ML IJ SOLN: 0.4000 mg | INTRAMUSCULAR | Status: DC | PRN

## 2012-07-15 MED ORDER — IBUPROFEN 600 MG PO TABS
600.0000 mg | ORAL_TABLET | Freq: Four times a day (QID) | ORAL | Status: DC
  Administered 2012-07-16 – 2012-07-17 (×7): 600 mg via ORAL
  Filled 2012-07-15 (×7): qty 1

## 2012-07-15 MED ORDER — SCOPOLAMINE 1 MG/3DAYS TD PT72
MEDICATED_PATCH | TRANSDERMAL | Status: AC
  Administered 2012-07-15: 1.5 mg via TRANSDERMAL
  Filled 2012-07-15: qty 1

## 2012-07-15 MED ORDER — PRENATAL MULTIVITAMIN CH
1.0000 | ORAL_TABLET | Freq: Every day | ORAL | Status: DC
  Administered 2012-07-16 – 2012-07-17 (×2): 1 via ORAL
  Filled 2012-07-15 (×2): qty 1

## 2012-07-15 MED ORDER — FENTANYL CITRATE 0.05 MG/ML IJ SOLN
INTRAMUSCULAR | Status: DC | PRN
  Administered 2012-07-15: 25 ug via INTRATHECAL

## 2012-07-15 MED ORDER — MEPERIDINE HCL 25 MG/ML IJ SOLN: 6.2500 mg | INTRAMUSCULAR | Status: DC | PRN

## 2012-07-15 MED ORDER — OXYTOCIN 10 UNIT/ML IJ SOLN
40.0000 [IU] | INTRAVENOUS | Status: DC | PRN
  Administered 2012-07-15: 40 [IU] via INTRAVENOUS

## 2012-07-15 MED ORDER — OXYTOCIN 10 UNIT/ML IJ SOLN
INTRAMUSCULAR | Status: AC
  Filled 2012-07-15: qty 4

## 2012-07-15 MED ORDER — TETANUS-DIPHTH-ACELL PERTUSSIS 5-2.5-18.5 LF-MCG/0.5 IM SUSP: 0.5000 mL | Freq: Once | INTRAMUSCULAR | Status: DC

## 2012-07-15 MED ORDER — CEFAZOLIN SODIUM-DEXTROSE 2-3 GM-% IV SOLR
2.0000 g | INTRAVENOUS | Status: AC
  Administered 2012-07-15: 2 g via INTRAVENOUS

## 2012-07-15 MED ORDER — LACTATED RINGERS IV SOLN
INTRAVENOUS | Status: DC
  Administered 2012-07-15 (×2): via INTRAVENOUS

## 2012-07-15 MED ORDER — EPHEDRINE SULFATE 50 MG/ML IJ SOLN
INTRAMUSCULAR | Status: DC | PRN
  Administered 2012-07-15: 10 mg via INTRAVENOUS

## 2012-07-15 MED ORDER — DEXAMETHASONE SODIUM PHOSPHATE 10 MG/ML IJ SOLN
INTRAMUSCULAR | Status: AC
  Filled 2012-07-15: qty 1

## 2012-07-15 MED ORDER — ONDANSETRON HCL 4 MG PO TABS: 4.0000 mg | ORAL_TABLET | ORAL | Status: DC | PRN

## 2012-07-15 MED ORDER — PROMETHAZINE HCL 25 MG/ML IJ SOLN
INTRAMUSCULAR | Status: AC
  Administered 2012-07-15: 6.25 mg via INTRAVENOUS
  Filled 2012-07-15: qty 1

## 2012-07-15 MED ORDER — PHENYLEPHRINE 40 MCG/ML (10ML) SYRINGE FOR IV PUSH (FOR BLOOD PRESSURE SUPPORT)
PREFILLED_SYRINGE | INTRAVENOUS | Status: AC
  Filled 2012-07-15: qty 10

## 2012-07-15 MED ORDER — SODIUM CHLORIDE 0.9 % IJ SOLN: 3.0000 mL | INTRAMUSCULAR | Status: DC | PRN

## 2012-07-15 MED ORDER — MORPHINE SULFATE (PF) 0.5 MG/ML IJ SOLN
INTRAMUSCULAR | Status: DC | PRN
  Administered 2012-07-15: .15 mg via INTRATHECAL

## 2012-07-15 MED ORDER — PHENYLEPHRINE HCL 10 MG/ML IJ SOLN
INTRAMUSCULAR | Status: DC | PRN
  Administered 2012-07-15: 80 ug via INTRAVENOUS
  Administered 2012-07-15 (×3): 40 ug via INTRAVENOUS

## 2012-07-15 MED ORDER — DIBUCAINE 1 % RE OINT: 1.0000 "application " | TOPICAL_OINTMENT | RECTAL | Status: DC | PRN

## 2012-07-15 MED ORDER — OXYTOCIN 40 UNITS IN LACTATED RINGERS INFUSION - SIMPLE MED: 62.5000 mL/h | INTRAVENOUS | Status: AC

## 2012-07-15 MED ORDER — PROMETHAZINE HCL 25 MG/ML IJ SOLN: 6.2500 mg | INTRAMUSCULAR | Status: DC | PRN

## 2012-07-15 MED ORDER — FENTANYL CITRATE 0.05 MG/ML IJ SOLN
INTRAMUSCULAR | Status: AC
  Filled 2012-07-15: qty 2

## 2012-07-15 MED ORDER — ONDANSETRON HCL 4 MG/2ML IJ SOLN
INTRAMUSCULAR | Status: AC
  Filled 2012-07-15: qty 2

## 2012-07-15 SURGICAL SUPPLY — 43 items
APL SKNCLS STERI-STRIP NONHPOA (GAUZE/BANDAGES/DRESSINGS) ×1
BENZOIN TINCTURE PRP APPL 2/3 (GAUZE/BANDAGES/DRESSINGS) ×2 IMPLANT
BINDER ABD UNIV 10 28-50 (GAUZE/BANDAGES/DRESSINGS) ×1 IMPLANT
BINDER ABD UNIV 12 45-62 (WOUND CARE) IMPLANT
BINDER ABDOM UNIV 10 (GAUZE/BANDAGES/DRESSINGS) ×2
BINDER ABDOMINAL 46IN 62IN (WOUND CARE)
CLAMP CORD UMBIL (MISCELLANEOUS) IMPLANT
CLOTH BEACON ORANGE TIMEOUT ST (SAFETY) ×2 IMPLANT
DRAPE LG THREE QUARTER DISP (DRAPES) ×2 IMPLANT
DRSG OPSITE 6X11 MED (GAUZE/BANDAGES/DRESSINGS) ×1 IMPLANT
DRSG OPSITE POSTOP 4X10 (GAUZE/BANDAGES/DRESSINGS) ×2 IMPLANT
DURAPREP 26ML APPLICATOR (WOUND CARE) ×2 IMPLANT
ELECT REM PT RETURN 9FT ADLT (ELECTROSURGICAL) ×2
ELECTRODE REM PT RTRN 9FT ADLT (ELECTROSURGICAL) ×1 IMPLANT
EXTRACTOR VACUUM M CUP 4 TUBE (SUCTIONS) IMPLANT
GLOVE BIO SURGEON STRL SZ7 (GLOVE) ×2 IMPLANT
GLOVE BIOGEL PI IND STRL 7.0 (GLOVE) ×1 IMPLANT
GLOVE BIOGEL PI INDICATOR 7.0 (GLOVE) ×1
GOWN STRL REIN XL XLG (GOWN DISPOSABLE) ×4 IMPLANT
KIT ABG SYR 3ML LUER SLIP (SYRINGE) IMPLANT
NDL HYPO 25X5/8 SAFETYGLIDE (NEEDLE) IMPLANT
NEEDLE HYPO 22GX1.5 SAFETY (NEEDLE) ×2 IMPLANT
NEEDLE HYPO 25X5/8 SAFETYGLIDE (NEEDLE) IMPLANT
NS IRRIG 1000ML POUR BTL (IV SOLUTION) ×2 IMPLANT
PACK C SECTION WH (CUSTOM PROCEDURE TRAY) ×2 IMPLANT
PAD ABD 7.5X8 STRL (GAUZE/BANDAGES/DRESSINGS) ×1 IMPLANT
PAD OB MATERNITY 4.3X12.25 (PERSONAL CARE ITEMS) ×2 IMPLANT
RTRCTR C-SECT PINK 25CM LRG (MISCELLANEOUS) IMPLANT
SPONGE SURGIFOAM ABS GEL 12-7 (HEMOSTASIS) IMPLANT
STAPLER VISISTAT 35W (STAPLE) IMPLANT
STRIP CLOSURE SKIN 1/2X4 (GAUZE/BANDAGES/DRESSINGS) ×2 IMPLANT
SUT PDS AB 0 CTX 60 (SUTURE) IMPLANT
SUT PLAIN 0 NONE (SUTURE) IMPLANT
SUT SILK 0 TIES 10X30 (SUTURE) IMPLANT
SUT VIC AB 0 CT1 36 (SUTURE) ×6 IMPLANT
SUT VIC AB 3-0 CT1 27 (SUTURE) ×2
SUT VIC AB 3-0 CT1 TAPERPNT 27 (SUTURE) ×1 IMPLANT
SUT VIC AB 4-0 KS 27 (SUTURE) ×1 IMPLANT
SYR CONTROL 10ML LL (SYRINGE) ×2 IMPLANT
TAPE CLOTH SURG 4X10 WHT LF (GAUZE/BANDAGES/DRESSINGS) ×1 IMPLANT
TOWEL OR 17X24 6PK STRL BLUE (TOWEL DISPOSABLE) ×4 IMPLANT
TRAY FOLEY CATH 14FR (SET/KITS/TRAYS/PACK) ×2 IMPLANT
WATER STERILE IRR 1000ML POUR (IV SOLUTION) ×2 IMPLANT

## 2012-07-15 NOTE — Anesthesia Postprocedure Evaluation (Signed)
  Anesthesia Post Note  Patient: Lisa Byrd  Procedure(s) Performed: Procedure(s) (LRB): Repeat CESAREAN SECTION  scar revision (N/A)  Anesthesia type: Spinal  Patient location: PACU  Post pain: Pain level controlled  Post assessment: Post-op Vital signs reviewed  Last Vitals:  Filed Vitals:   07/15/12 1630  BP: 114/42  Pulse: 78  Temp:   Resp: 17    Post vital signs: Reviewed  Level of consciousness: awake  Complications: No apparent anesthesia complications

## 2012-07-15 NOTE — Anesthesia Preprocedure Evaluation (Signed)
Anesthesia Evaluation  Patient identified by MRN, date of birth, ID band Patient awake    Reviewed: Allergy & Precautions, H&P , NPO status , Patient's Chart, lab work & pertinent test results  Airway Mallampati: II      Dental no notable dental hx.    Pulmonary neg pulmonary ROS,  breath sounds clear to auscultation  Pulmonary exam normal       Cardiovascular Exercise Tolerance: Good negative cardio ROS  Rhythm:regular Rate:Normal     Neuro/Psych negative neurological ROS  negative psych ROS   GI/Hepatic negative GI ROS, Neg liver ROS,   Endo/Other  negative endocrine ROS  Renal/GU negative Renal ROS  negative genitourinary   Musculoskeletal   Abdominal Normal abdominal exam  (+)   Peds  Hematology negative hematology ROS (+) anemia ,   Anesthesia Other Findings   Reproductive/Obstetrics (+) Pregnancy                           Anesthesia Physical Anesthesia Plan  ASA: II  Anesthesia Plan: Spinal   Post-op Pain Management:    Induction:   Airway Management Planned:   Additional Equipment:   Intra-op Plan:   Post-operative Plan:   Informed Consent: I have reviewed the patients History and Physical, chart, labs and discussed the procedure including the risks, benefits and alternatives for the proposed anesthesia with the patient or authorized representative who has indicated his/her understanding and acceptance.     Plan Discussed with: Anesthesiologist, CRNA and Surgeon  Anesthesia Plan Comments:         Anesthesia Quick Evaluation

## 2012-07-15 NOTE — Anesthesia Procedure Notes (Signed)
Spinal  Patient location during procedure: OR Start time: 07/15/2012 3:11 PM Staffing Anesthesiologist: Angus Seller., Harrell Gave. Performed by: anesthesiologist  Preanesthetic Checklist Completed: patient identified, site marked, surgical consent, pre-op evaluation, timeout performed, IV checked, risks and benefits discussed and monitors and equipment checked Spinal Block Patient position: sitting Prep: DuraPrep Patient monitoring: heart rate, cardiac monitor, continuous pulse ox and blood pressure Approach: midline Location: L3-4 Injection technique: single-shot Needle Needle type: Sprotte  Needle gauge: 24 G Needle length: 9 cm Assessment Sensory level: T4 Additional Notes Patient identified.  Risk benefits discussed including failed block, incomplete pain control, headache, nerve damage, paralysis, blood pressure changes, nausea, vomiting, reactions to medication both toxic or allergic, and postpartum back pain.  Patient expressed understanding and wished to proceed.  All questions were answered.  Sterile technique used throughout procedure.  CSF was clear.  No parasthesia or other complications.  Please see nursing notes for vital signs.

## 2012-07-15 NOTE — Transfer of Care (Signed)
Immediate Anesthesia Transfer of Care Note  Patient: Lisa Byrd  Procedure(s) Performed: Procedure(s): Repeat CESAREAN SECTION  scar revision (N/A)  Patient Location: PACU  Anesthesia Type:Spinal  Level of Consciousness: awake, alert , oriented and patient cooperative  Airway & Oxygen Therapy: Patient Spontanous Breathing  Post-op Assessment: Report given to PACU RN and Post -op Vital signs reviewed and stable  Post vital signs: Reviewed and stable  Complications: No apparent anesthesia complications

## 2012-07-15 NOTE — H&P (Signed)
Lisa Byrd is a 22 y.o. female presenting for repeat c-section. Declines TOLAC. History OB History   Grav Para Term Preterm Abortions TAB SAB Ect Mult Living   2 1 1       1      Past Medical History  Diagnosis Date  . Broken arm     as child  . Abnormal Pap smear   . Medical history non-contributory   . Anemia    Past Surgical History  Procedure Laterality Date  . Cesarean section  10/26/2010    Procedure: CESAREAN SECTION;  Surgeon: Zelphia Cairo;  Location: [facility];  Service: Gynecology;  Laterality: N/A;   Family History: family history includes Diabetes in her maternal uncle and Hypertension in her father. Social History:  reports that she quit smoking about 14 months ago. Her smoking use included Cigarettes. She smoked 0.50 packs per day. She has never used smokeless tobacco. She reports that she uses illicit drugs (Marijuana). She reports that she does not drink alcohol.   Prenatal Transfer Tool  Maternal Diabetes: No Genetic Screening: Normal Maternal Ultrasounds/Referrals: Normal Fetal Ultrasounds or other Referrals:  None Maternal Substance Abuse:  No Significant Maternal Medications:  None Significant Maternal Lab Results:  None Other Comments:  None  ROS    Blood pressure 119/72, pulse 92, temperature 98.4 F (36.9 C), temperature source Oral, resp. rate 20, last menstrual period 10/16/2011, SpO2 100.00%. 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  The risks of surgery were discussed in detail with the patient including but not limited to: bleeding which may require transfusion or reoperation; infection which may require  prolonged hospitalization or re-hospitalization and antibiotic therapy; injury to bowel, bladder, ureters and major vessels or other surrounding organs; need for additional procedures including laparotomy; thromboembolic phenomenon, incisional problems and other postoperative or anesthesia complications.  Patient was told that the likelihood that her condition and symptoms will be treated effectively with this surgical management was very high; the postoperative expectations were also discussed in detail. The patient also understands the alternative treatment options which were discussed in full. All questions were answered.  Desires Nexplanon after the procedure.    HARRAWAY-SMITH, Welby Montminy 07/15/2012, 1:12 PM

## 2012-07-15 NOTE — Op Note (Signed)
Lisa Byrd PROCEDURE DATE: 07/15/2012  PREOPERATIVE DIAGNOSIS: Intrauterine pregnancy at  [redacted]w[redacted]d weeks gestation; previous c-section  POSTOPERATIVE DIAGNOSIS: The same  PROCEDURE: Primary/Repeat Low Transverse Cesarean Section  SURGEON:  Dr. Eber Jones L. Harraway-Smith  ASSISTANT:  none   INDICATIONS: Lisa Byrd is a 22 y.o. [id number] at [redacted]w[redacted]d here for cesarean section secondary to the indications listed under preoperative diagnosis; please see preoperative note for further details.  The risks of cesarean section were discussed with the patient including but were not limited to: bleeding which may require transfusion or reoperation; infection which may require antibiotics; injury to bowel, bladder, ureters or other surrounding organs; injury to the fetus; need for additional procedures including hysterectomy in the event of a life-threatening hemorrhage; placental abnormalities wth subsequent pregnancies, incisional problems, thromboembolic phenomenon and other postoperative/anesthesia complications.   The patient concurred with the proposed plan, giving informed written consent for the procedure.    FINDINGS:  Viable female infant in cephalic presentation.  Apgars 8 and 8.  Clear amniotic fluid.  Intact placenta, three vessel cord.  Normal uterus, fallopian tubes and ovaries bilaterally.  ANESTHESIA: Spinal/Epidural INTRAVENOUS FLUIDS: 2300 ml ESTIMATED BLOOD LOSS:700 ml URINE OUTPUT:  300 ml SPECIMENS: Placenta sent to pathology/L&D COMPLICATIONS: None immediate  PROCEDURE IN DETAIL:  The patient preoperatively received intravenous antibiotics and had sequential compression devices applied to her lower extremities.  She was then taken to the operating room where spinal anesthesia was administered and was found to be adequate. She was then placed in a dorsal supine position with a leftward tilt, and prepped and draped in a sterile manner.  A foley catheter was placed into her bladder and  attached to constant gravity.  After an adequate timeout was performed, a Pfannenstiel skin incision was made with scalpel and carried through to the underlying layer of fascia. The fascia was incised in the midline, and this incision was extended bilaterally using the Mayo scissors.  Kocher clamps were applied to the superior aspect of the fascial incision and the underlying rectus muscles were dissected off bluntly. A similar process was carried out on the inferior aspect of the fascial incision. The rectus muscles were separated in the midline bluntly and the peritoneum was entered bluntly. Attention was turned to the lower uterine segment where a low transverse hysterotomy incision was made with a scalpel and extended bilaterally bluntly.  The infant was successfully delivered, the cord was clamped and cut and the infant was handed over to awaiting neonatology team. Uterine massage was then administered, and the placenta delivered intact with a three-vessel cord. The uterus was then cleared of clot and debris.  The hysterotomy was closed with 0 Vicryl in a running locked fashion, and an imbricating layer was also placed with the same suture. The uterus was returned to the pelvis. The pelvis was cleared of all clot and debris. Hemostasis was confirmed on all surfaces.  The peritoneum and the muscles were reapproximated using 0Vicryl in 1 interrupted suture. The fascia was then closed using 0 Vicryl in a running fashion.  The subcutaneous layer was irrigated.  Patient had a keloid from her previous surgery.  The scar was removed using a scalpel and the was closed with a 4-0 Vicryl subcuticular stitch. The patient tolerated the procedure well. Sponge, lap, instrument and needle counts were correct x 2.  She was taken to the recovery room in stable condition.

## 2012-07-16 ENCOUNTER — Encounter: Payer: Self-pay | Admitting: Family Medicine

## 2012-07-16 ENCOUNTER — Encounter (HOSPITAL_COMMUNITY): Payer: Self-pay | Admitting: Obstetrics & Gynecology

## 2012-07-16 LAB — CULTURE, BETA STREP (GROUP B ONLY)

## 2012-07-16 LAB — CBC
HCT: 32.2 % — ABNORMAL LOW (ref 36.0–46.0)
MCH: 30.4 pg (ref 26.0–34.0)
MCV: 91.5 fL (ref 78.0–100.0)
Platelets: 226 10*3/uL (ref 150–400)
RDW: 13.2 % (ref 11.5–15.5)
WBC: 14.8 10*3/uL — ABNORMAL HIGH (ref 4.0–10.5)

## 2012-07-16 LAB — RPR: RPR Ser Ql: NONREACTIVE

## 2012-07-16 NOTE — Anesthesia Postprocedure Evaluation (Signed)
  Anesthesia Post-op Note  Patient: Lisa Byrd  Procedure(s) Performed: Procedure(s): Repeat CESAREAN SECTION  scar revision (N/A)  Patient Location: Mother/Baby  Anesthesia Type:Spinal  Level of Consciousness: awake, alert , oriented and patient cooperative  Airway and Oxygen Therapy: Patient Spontanous Breathing  Post-op Pain: mild  Post-op Assessment: Patient's Cardiovascular Status Stable and Respiratory Function Stable  Post-op Vital Signs: stable  Complications: No apparent anesthesia complications

## 2012-07-16 NOTE — Progress Notes (Signed)
CSW met with pt today.  Full assessment to follow.  No barriers to discharge.

## 2012-07-16 NOTE — Progress Notes (Signed)
Subjective: Postpartum Day #1: Cesarean Delivery Patient reports tolerating PO.  Breast and bottlefeeding; desires Nexplanon for contraception  Objective: Vital signs in last 24 hours: Temp:  [97.3 F (36.3 C)-98.4 F (36.9 C)] 98.3 F (36.8 C) (06/04 0605) Pulse Rate:  [63-112] 70 (06/04 0605) Resp:  [16-22] 16 (06/04 0605) BP: (102-132)/(42-86) 117/64 mmHg (06/04 0605) SpO2:  [97 %-100 %] 98 % (06/04 0605) Weight:  [89.812 kg (198 lb)] 89.812 kg (198 lb) (06/03 1844)  Physical Exam:  General: alert, cooperative and mild distress Lungs: nl effort Heart: RRR Lochia: appropriate Uterine Fundus: firm Incision: healing well, honeycomb dsg stained and unchanged DVT Evaluation: No evidence of DVT seen on physical exam.   Recent Labs  07/15/12 1255 07/16/12 0610  HGB 11.1* 10.7*  HCT 34.0* 32.2*    Assessment/Plan: Status post Cesarean section. Doing well postoperatively.  Continue current care.  Cam Hai 07/16/2012, 7:28 AM

## 2012-07-16 NOTE — Progress Notes (Signed)
Ur chart review completed.  

## 2012-07-17 MED ORDER — IBUPROFEN 600 MG PO TABS: 600.0000 mg | ORAL_TABLET | Freq: Four times a day (QID) | ORAL | Status: DC

## 2012-07-17 MED ORDER — OXYCODONE-ACETAMINOPHEN 5-325 MG PO TABS: 1.0000 | ORAL_TABLET | ORAL | Status: DC | PRN

## 2012-07-17 NOTE — Discharge Summary (Signed)
Obstetric Discharge Summary Reason for Admission: cesarean section Prenatal Procedures: none Intrapartum Procedures: cesarean: low cervical, transverse Postpartum Procedures: none Complications-Operative and Postpartum: none Hemoglobin  Date Value Range Status  07/16/2012 10.7* 12.0 - 15.0 g/dL Final     HCT  Date Value Range Status  07/16/2012 32.2* 36.0 - 46.0 % Final  Hospital Course: PROCEDURE DATE: 07/15/2012  PREOPERATIVE DIAGNOSIS: Intrauterine pregnancy at [redacted]w[redacted]d weeks gestation; previous c-section  POSTOPERATIVE DIAGNOSIS: The same  PROCEDURE: Primary/Repeat Low Transverse Cesarean Section  SURGEON: Dr. Eber Jones L. Harraway-Smith  ASSISTANT: none  INDICATIONS: Lisa Byrd is a 22 y.o. [id number] at [redacted]w[redacted]d here for cesarean section secondary to the indications listed under preoperative diagnosis; please see preoperative note for further details. The risks of cesarean section were discussed with the patient including but were not limited to: bleeding which may require transfusion or reoperation; infection which may require antibiotics; injury to bowel, bladder, ureters or other surrounding organs; injury to the fetus; need for additional procedures including hysterectomy in the event of a life-threatening hemorrhage; placental abnormalities wth subsequent pregnancies, incisional problems, thromboembolic phenomenon and other postoperative/anesthesia complications. The patient concurred with the proposed plan, giving informed written consent for the procedure.  FINDINGS: Viable female infant in cephalic presentation. Apgars 8 and 8. Clear amniotic fluid. Intact placenta, three vessel cord. Normal uterus, fallopian tubes and ovaries bilaterally.  She has done well in the postoperative phase. She is up and around, tolerating food and fluids. She prefers an early discharge.  Physical Exam:  General: alert and no distress Lochia: appropriate Uterine Fundus: firm Incision: healing well, no  significant drainage DVT Evaluation: No evidence of DVT seen on physical exam. Negative Homan's sign.  Discharge Diagnoses: Term Pregnancy-delivered  Discharge Information: Date: 07/17/2012 Activity: unrestricted and pelvic rest Recommend no driving for 2 weeks Diet: routine Medications: PNV, Ibuprofen and Percocet Condition: stable Instructions: refer to practice specific booklet Discharge to: home Follow-up Information   [facility] up with [facility]. Schedule an appointment as soon as possible for a visit in 6 weeks.   Contact information:   [phone]       Newborn Data: Live born female  Birth Weight: 7 lb 8.8 oz (3425 g) APGAR: 8, 8  Home with mother.  [facility] 07/17/2012, 7:30 AM

## 2012-07-17 NOTE — Clinical Social Work Maternal (Addendum)
    LATE ENTRY FROM 07/16/12:  Clinical Social Work Department PSYCHOSOCIAL ASSESSMENT - MATERNAL/CHILD 07/17/2012  Patient:  Lisa Byrd, Lisa Byrd  Account Number:  [id number]  Admit Date:  07/15/2012  Marjo Bicker Name:   Sander Radon    Clinical Social Worker:  Nobie Putnam, LCSW   Date/Time:  07/17/2012 12:05 PM  Date Referred:  07/17/2012      Referred reason  Behavioral Health Issues  Substance Abuse   Other referral source:    I:  FAMILY / HOME ENVIRONMENT Child's legal guardian:  PARENT  Guardian - Name Guardian - Age Guardian - Address  Breslin Burklow [address] [place], [place] [address]  Lurlean Horns 26    Other household support members/support persons Name Relationship DOB  Renee Harder 10/26/10  Jarvis Newcomer MOTHER    BROTHER 27 years old   Other support:    II  PSYCHOSOCIAL DATA Information Source:  Patient Interview  Event organiser Employment:   Surveyor, quantity resources:  Medicaid If Medicaid - County:  [place] Other  Sales executive  WIC   School / Grade:   Maternity Care Coordinator / Child Services Coordination / Early Interventions:  Cultural issues impacting care:    III  STRENGTHS Strengths  Adequate Resources  Home prepared for Child (including basic supplies)  Supportive family/friends   Strength comment:    IV  RISK FACTORS AND CURRENT PROBLEMS Current Problem:  YES   Risk Factor & Current Problem Patient Issue Family Issue Risk Factor / Current Problem Comment  Mental Illness Y N Hx of cutting & SI  Substance Abuse Y N Hx MJ use    V  SOCIAL WORK ASSESSMENT CSW referral received to assess pt's substance use & mental health history.  Pt admits to smoking MJ "once a week" prior to pregnancy confirmation at 21 weeks.  Once pregnancy was confirmed, she stopped smoking MJ & cigarettes.  She denies other illegal substance use & verbalized understanding of hospital drug testing policy. UDS is negative, meconium results are pending.   Pt's admission to [facility] 4/13 noted.  Pt admitted to taking several Ibuprofen & Advil pills but denies that it was a suicide attempt.  She told CW that she & FOB had an argument & she had "a lot going on" at that time.  Pt told CSW that her son was 9 months at that time & she thought about him.  She did not participate in any follow up treatment & denies any depression since then.  Pt & her mother adamantly denies documented history of "cutting."  Pt's mother appears to be her primary support person.  FOB is not involved or supportive.  CSW provided pt with an application for child support, at her request.  She has supplies for the infant. Pt reports feeling okay now.  She agrees to seek medical attention if PP depression symptoms arise.  CSW will continue to monitor drug screen results and make a referral if needed.      VI SOCIAL WORK PLAN Social Work Plan  No Further Intervention Required / No Barriers to Discharge   Type of pt/family education:   If child protective services report - county:   If child protective services report - date:   Information/referral to community resources comment:   Other social work plan:

## 2012-08-25 ENCOUNTER — Ambulatory Visit: Payer: Medicaid Other | Admitting: Obstetrics and Gynecology

## 2012-09-26 ENCOUNTER — Ambulatory Visit (INDEPENDENT_AMBULATORY_CARE_PROVIDER_SITE_OTHER): Payer: Medicaid Other | Admitting: Obstetrics and Gynecology

## 2012-09-26 ENCOUNTER — Encounter: Payer: Self-pay | Admitting: *Deleted

## 2012-09-26 ENCOUNTER — Encounter: Payer: Self-pay | Admitting: Obstetrics and Gynecology

## 2012-09-26 DIAGNOSIS — Z30017 Encounter for initial prescription of implantable subdermal contraceptive: Secondary | ICD-10-CM

## 2012-09-26 MED ORDER — ETONOGESTREL 68 MG ~~LOC~~ IMPL
68.0000 mg | DRUG_IMPLANT | Freq: Once | SUBCUTANEOUS | Status: AC
  Administered 2012-09-26: 68 mg via SUBCUTANEOUS

## 2012-09-26 NOTE — Progress Notes (Signed)
  Subjective:     Lisa Byrd is a 22 y.o. female G2P2 who presents for a postpartum visit. She is 6 weeks postpartum following a low cervical transverse Cesarean section. I have fully reviewed the prenatal and intrapartum course. The delivery was at 39 gestational weeks. Outcome: repeat cesarean section, low transverse incision. Anesthesia: spinal. Postpartum course has been uncomplicated. Baby's course has been uncomplicated. Baby is feeding by bottle - Gerber-Smooth. Bleeding no bleeding. Bowel function is normal. Bladder function is normal. Patient is not sexually active. Contraception method is none. Postpartum depression screening: negative.     Review of Systems A comprehensive review of systems was negative.   Objective:    BP 115/72  Pulse 87  Temp(Src) 97.9 F (36.6 C) (Oral)  Ht 5\' 6"  (1.676 m)  Wt 168 lb 12.8 oz (76.567 kg)  BMI 27.26 kg/m2  LMP 09/23/2012  General:  alert, cooperative and no distress   Breasts:  inspection negative, no nipple discharge or bleeding, no masses or nodularity palpable  Lungs: clear to auscultation bilaterally  Heart:  regular rate and rhythm  Abdomen: soft, non-tender; bowel sounds normal; no masses,  no organomegaly and incision: healed well   Vulva:  normal  Vagina: normal vagina, no discharge, exudate, lesion, or erythema  Cervix:  multiparous appearance and no lesions  Corpus: normal size, contour, position, consistency, mobility, non-tender  Adnexa:  no mass, fullness, tenderness  Rectal Exam: Not performed.        Assessment:     Normal postpartum exam. Pap smear not done at today's visit.   Plan:    1. Contraception: Nexplanon 2. Patient given informed consent, signed copy in the chart, time out was performed. Pregnancy test was negative. Appropriate time out taken.  Patient's right arm was prepped and draped in the usual sterile fashion.. The ruler used to measure and mark insertion area.  Pt was prepped with alcohol swab  and then injected with 2 cc of 1% lidocaine with epinephrine.  Pt was prepped with betadine, Implanon removed form packaging,  Device confirmed in needle, then inserted full length of needle and withdrawn per handbook instructions.  Pt insertion site covered with pressure dressing.   Minimal blood loss.  Pt tolerated the procedure well.   3. Follow up in: 1 year for annual exam or as needed.

## 2012-12-18 ENCOUNTER — Other Ambulatory Visit: Payer: Self-pay

## 2013-11-03 IMAGING — US US OB COMP +14 WK
1 of 2 series · 12 of 28 positions shown · non-contrast
Comparison: none

[Series 1: us ob comp +14 wk · 69 acquisitions, 12 frames shown]
[im 3/69]
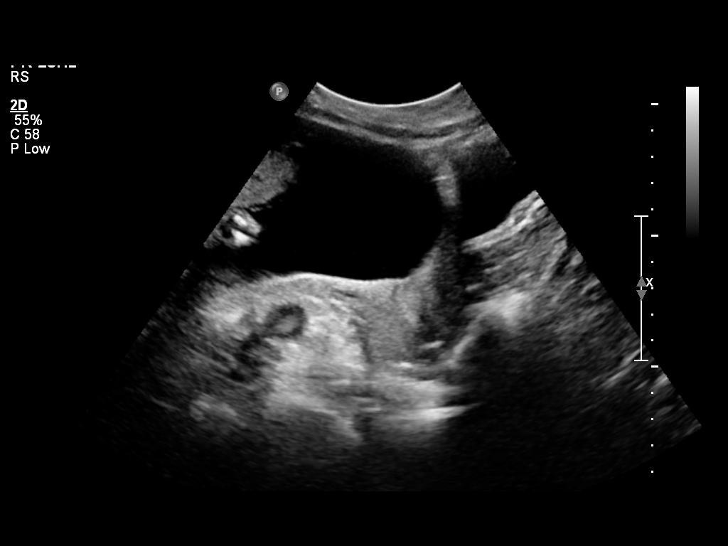
[im 8/69]
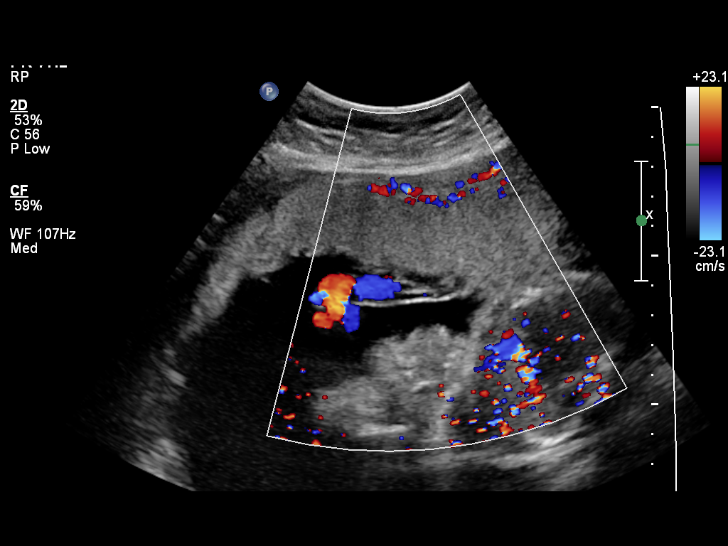
[im 14/69]
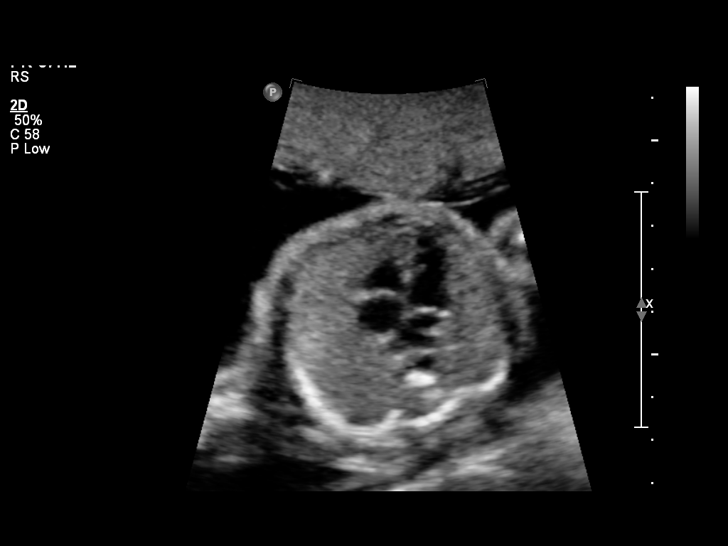
[im 21/69]
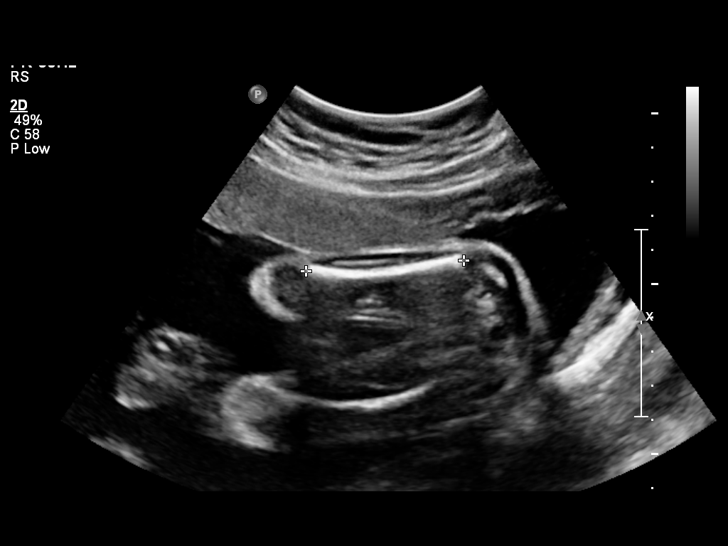
[im 27/69]
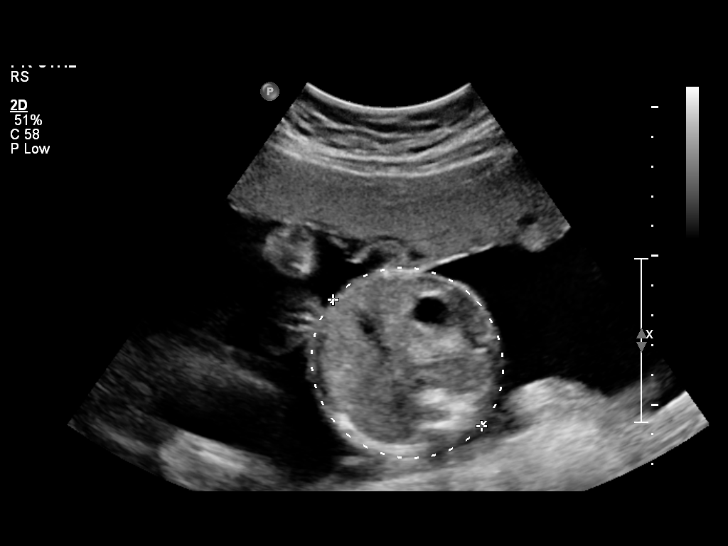
[im 32/69]
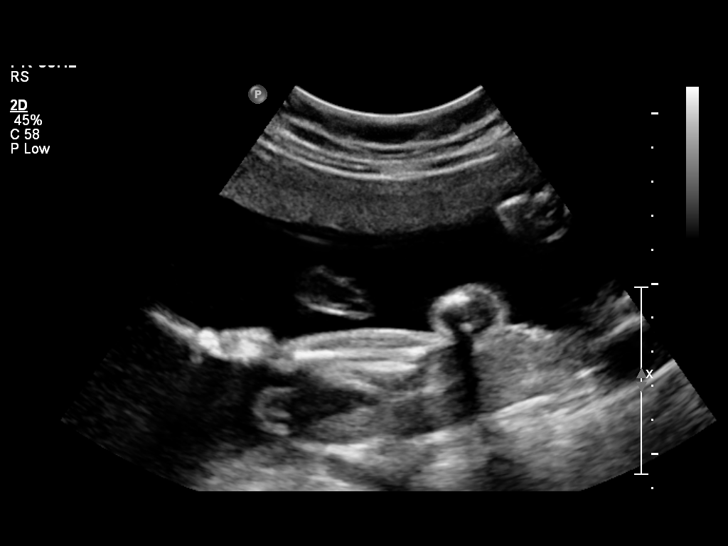
[im 40/69]
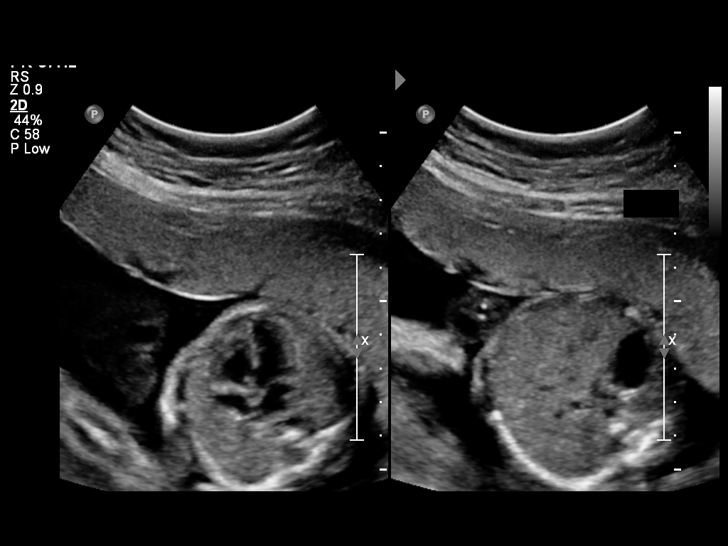
[im 45/69]
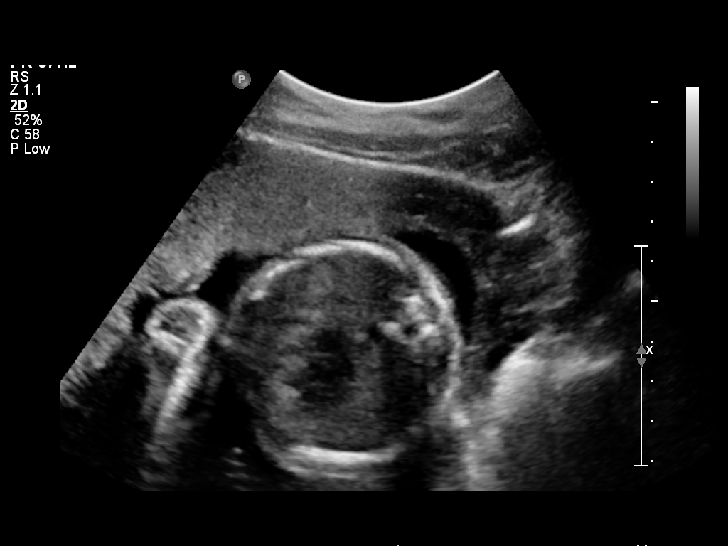
[im 50/69]
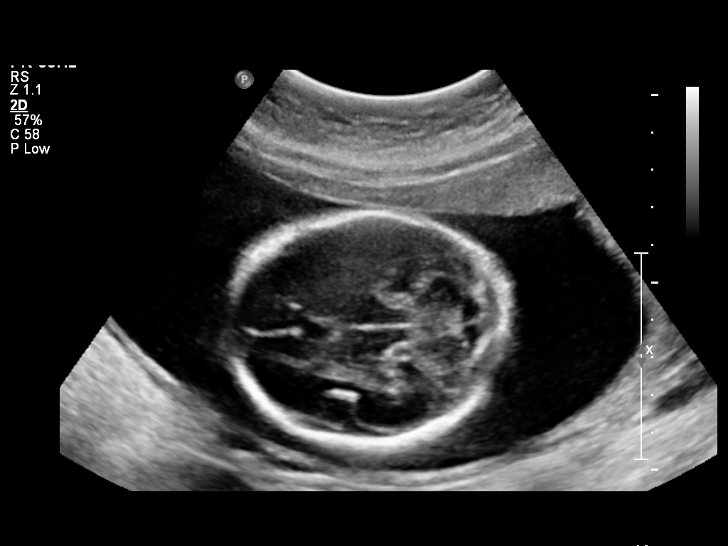
[im 58/69]
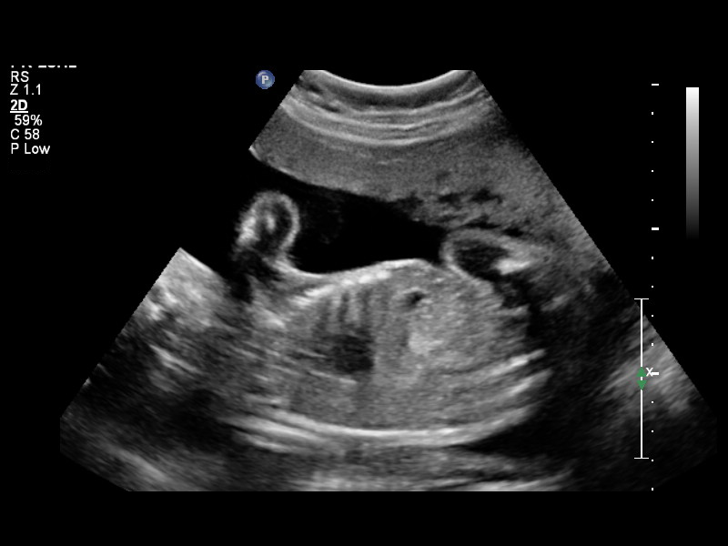
[im 63/69]
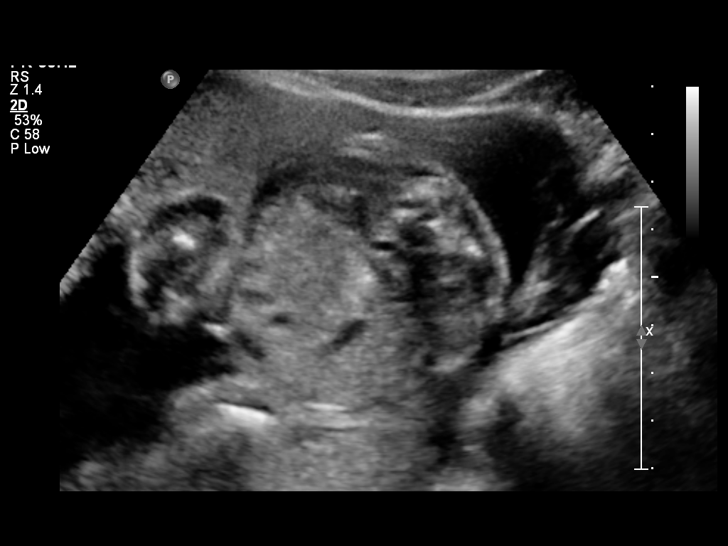
[im 69/69]
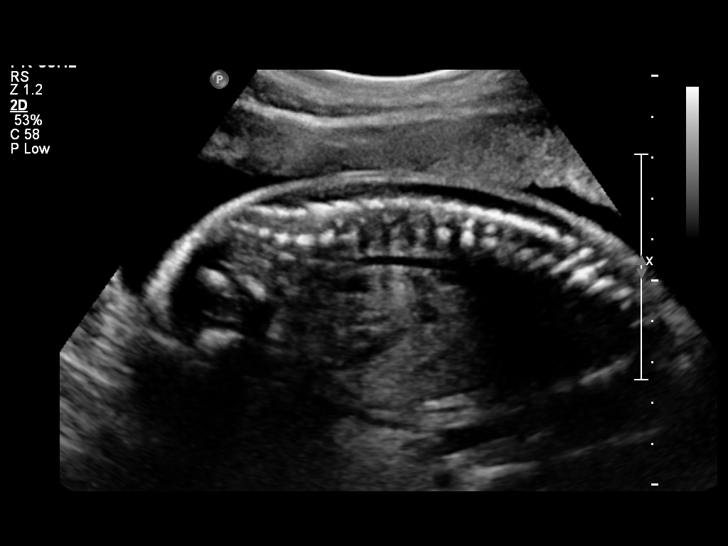

[12 of 28 positions shown; findings below may reference images not displayed]

OBSTETRICS REPORT
                      (Signed Final 04/03/2012 [DATE])

Service(s) Provided

 US OB COMP + 14 WK                                    76805.1
Indications

 Size greater than dates (Large for gestational [AGE]
 No or Little Prenatal Care
 Uncertain LMP;  Establish Gestational [AGE]
 Cigarette smoker
 Prior c-section
Fetal Evaluation

 Num Of Fetuses:    1
 Fetal Heart Rate:  130                         bpm
 Cardiac Activity:  Observed
 Presentation:      Breech
 Placenta:          Anterior, above cervical os
 P. Cord            Visualized, central
 Insertion:

 Amniotic Fluid
 AFI FV:      Subjectively within normal limits
                                             Larg Pckt:     7.8  cm
Biometry

 BPD:       61  mm    G. Age:   24w 6d                CI:         74.4   70 - 86
                                                      FL/HC:      20.3   18.7 -

 HC:     224.5  mm    G. Age:   24w 3d       23  %    HC/AC:      1.13   1.04 -

 AC:     197.9  mm    G. Age:   24w 3d       33  %    FL/BPD:     74.8   71 - 87
 FL:      45.6  mm    G. Age:   25w 1d       49  %    FL/AC:      23.0   20 - 24
 HUM:     44.6  mm    G. Age:   26w 3d       86  %

 Est. FW:     726  gm    1 lb 10 oz      53  %
Gestational Age

 U/S Today:     24w 5d                                        EDD:   07/19/12
 Best:          24w 5d    Det. By:   U/S (04/03/12)           EDD:   07/19/12
Anatomy
 Cranium:          Appears normal         Aortic Arch:      Basic anatomy
                                                            exam per order
 Fetal Cavum:      Appears normal         Ductal Arch:      Basic anatomy
                                                            exam per order
 Ventricles:       Appears normal         Diaphragm:        Appears normal
 Choroid Plexus:   Appears normal         Stomach:          Appears normal, left
                                                            sided
 Cerebellum:       Appears normal         Abdomen:          Appears normal
 Posterior Fossa:  Appears normal         Abdominal Wall:   Appears nml (cord
                                                            insert, abd wall)
 Nuchal Fold:      Not applicable (>20    Cord Vessels:     Appears normal (3
                   wks GA)                                  vessel cord)
 Face:             Profile appears        Kidneys:          Appear normal
                   normal
 Lips:             Appears normal         Bladder:          Appears normal
 Heart:            Appears normal         Spine:            Appears normal
                   (4CH, axis, and
                   situs)
 RVOT:             Appears normal         Lower             Appears normal
                                          Extremities:
 LVOT:             Appears normal         Upper             Appears normal
                                          Extremities:

 Other:  Female gender.
Cervix Uterus Adnexa

 Cervical Length:   3.1       cm

 Cervix:       Normal appearance by transabdominal scan.
 Uterus:       No abnormality visualized.
 Left Ovary:   No adnexal mass visualized.
 Right Ovary:  No adnexal mass visualized.

 Adnexa:     No abnormality visualized.
Impression

 Single intrauterine gestation demonstrating an estimated
 gestational age by ultrasound of 24w 5d. Fetal parameters
 correlate well with this composite EGA. EFW is currently at
 the 53%

 Visualized fetal anatomy appears normal. No focal placental
 abnormalities are seen.

 Subjectively and quantitatively normal amniotic fluid volume.

 Normal cervical length and appearance.

 questions or concerns.

## 2013-12-14 ENCOUNTER — Encounter: Payer: Self-pay | Admitting: Obstetrics and Gynecology

## 2014-05-26 ENCOUNTER — Encounter (HOSPITAL_COMMUNITY): Payer: Self-pay

## 2014-05-26 ENCOUNTER — Other Ambulatory Visit (HOSPITAL_COMMUNITY)
Admission: RE | Admit: 2014-05-26 | Discharge: 2014-05-26 | Disposition: A | Discharge status: Home / Self Care | Payer: Medicaid Other | Source: Ambulatory Visit | Attending: Emergency Medicine | Admitting: Emergency Medicine

## 2014-05-26 ENCOUNTER — Emergency Department (INDEPENDENT_AMBULATORY_CARE_PROVIDER_SITE_OTHER)
Admission: EM | Admit: 2014-05-26 | Discharge: 2014-05-26 | Disposition: A | Discharge status: Home / Self Care | Payer: Self-pay | Source: Home / Self Care | Attending: Emergency Medicine | Admitting: Emergency Medicine

## 2014-05-26 DIAGNOSIS — N760 Acute vaginitis: Secondary | ICD-10-CM | POA: Diagnosis present

## 2014-05-26 DIAGNOSIS — N898 Other specified noninflammatory disorders of vagina: Secondary | ICD-10-CM

## 2014-05-26 DIAGNOSIS — N730 Acute parametritis and pelvic cellulitis: Secondary | ICD-10-CM

## 2014-05-26 DIAGNOSIS — Z113 Encounter for screening for infections with a predominantly sexual mode of transmission: Secondary | ICD-10-CM | POA: Insufficient documentation

## 2014-05-26 LAB — POCT URINALYSIS DIP (DEVICE)
BILIRUBIN URINE: NEGATIVE
Glucose, UA: NEGATIVE mg/dL
KETONES UR: NEGATIVE mg/dL
Nitrite: NEGATIVE
PROTEIN: NEGATIVE mg/dL
SPECIFIC GRAVITY, URINE: 1.025 (ref 1.005–1.030)
Urobilinogen, UA: 1 mg/dL (ref 0.0–1.0)
pH: 7 (ref 5.0–8.0)

## 2014-05-26 LAB — POCT PREGNANCY, URINE: PREG TEST UR: NEGATIVE

## 2014-05-26 MED ORDER — ONDANSETRON 4 MG PO TBDP
8.0000 mg | ORAL_TABLET | Freq: Once | ORAL | Status: AC
  Administered 2014-05-26: 8 mg via ORAL

## 2014-05-26 MED ORDER — LIDOCAINE HCL (PF) 1 % IJ SOLN
INTRAMUSCULAR | Status: AC
  Filled 2014-05-26: qty 5

## 2014-05-26 MED ORDER — CEFTRIAXONE SODIUM 250 MG IJ SOLR
INTRAMUSCULAR | Status: AC
  Filled 2014-05-26: qty 250

## 2014-05-26 MED ORDER — AZITHROMYCIN 250 MG PO TABS
ORAL_TABLET | ORAL | Status: AC
  Filled 2014-05-26: qty 4

## 2014-05-26 MED ORDER — METRONIDAZOLE 500 MG PO TABS: 500.0000 mg | ORAL_TABLET | Freq: Two times a day (BID) | ORAL | Status: DC

## 2014-05-26 MED ORDER — CEFTRIAXONE SODIUM 250 MG IJ SOLR
250.0000 mg | Freq: Once | INTRAMUSCULAR | Status: AC
  Administered 2014-05-26: 250 mg via INTRAMUSCULAR

## 2014-05-26 MED ORDER — ONDANSETRON 4 MG PO TBDP
ORAL_TABLET | ORAL | Status: AC
  Filled 2014-05-26: qty 2

## 2014-05-26 MED ORDER — AZITHROMYCIN 250 MG PO TABS
1000.0000 mg | ORAL_TABLET | Freq: Every day | ORAL | Status: DC
  Administered 2014-05-26 (×2): 1000 mg via ORAL

## 2014-05-26 NOTE — ED Provider Notes (Signed)
CSN: [id number]     Arrival date & time 05/26/14  1004 History   First MD Initiated Contact with Patient 05/26/14 1039     Chief Complaint  Patient presents with  . SEXUALLY TRANSMITTED DISEASE   (Consider location/radiation/quality/duration/timing/severity/associated sxs/prior Treatment) HPI Comments: 24 year old female complaining of a vaginal discharge for 2-3 weeks. He has recently been blood-tinged. She states that has a foul odor to it. LMP was 05/08/2014. She denies having pelvic pain. She does states that her pelvis is tender if her children are crawling on top of her.   Past Medical History  Diagnosis Date  . Broken arm     as child  . Abnormal Pap smear   . Medical history non-contributory   . Anemia    Past Surgical History  Procedure Laterality Date  . Cesarean section  10/26/2010    Procedure: CESAREAN SECTION;  Surgeon: Zelphia Cairo;  Location: [facility];  Service: Gynecology;  Laterality: N/A;  . Cesarean section N/A 07/15/2012    Procedure: Repeat CESAREAN SECTION  scar revision;  Surgeon: Willodean Rosenthal, MD;  Location: [facility];  Service: Obstetrics;  Laterality: N/A;   Family History  Problem Relation Age of Onset  . Hypertension Father   . Diabetes Maternal Uncle    History  Substance Use Topics  . Smoking status: Former Smoker -- 0.50 packs/day    Types: Cigarettes    Quit date: 04/24/2011  . Smokeless tobacco: Never Used  . Alcohol Use: No     Comment: Pt denies    OB History    Gravida Para Term Preterm AB TAB SAB Ectopic Multiple Living   Review of Systems  Constitutional: Negative.   Gastrointestinal: Negative.   Genitourinary: Positive for vaginal bleeding, vaginal discharge and menstrual problem. Negative for dysuria, urgency, frequency, flank pain and pelvic pain.  Skin: Negative.   Neurological: Negative.     Allergies  Review of patient's allergies indicates no known allergies.  Home Medications   Prior to  Admission medications   Medication Sig Start Date End Date Taking? Authorizing Provider  ibuprofen (ADVIL,MOTRIN) 600 MG tablet Take 1 tablet (600 mg total) by mouth every 6 (six) hours. 07/17/12   Aviva Signs, CNM  metroNIDAZOLE (FLAGYL) 500 MG tablet Take 1 tablet (500 mg total) by mouth 2 (two) times daily. X 7 days 05/26/14   Hayden Rasmussen, NP  oxyCODONE-acetaminophen (PERCOCET/ROXICET) 5-325 MG per tablet Take 1-2 tablets by mouth every 4 (four) hours as needed. 07/17/12   Aviva Signs, CNM  Prenatal Vit-Fe Fumarate-FA (PRENATAL VITAMINS) 28-0.8 MG TABS Take 1 tablet by mouth daily. 06/16/12   Aviva Signs, CNM   BP 114/84 mmHg  Pulse 72  Temp(Src) 98.2 F (36.8 C) (Oral)  Resp 16  SpO2 100%  LMP 05/08/2014 (Exact Date) Physical Exam  Constitutional: She is oriented to person, place, and time. She appears well-developed and well-nourished. No distress.  Neck: Normal range of motion. Neck supple.  Pulmonary/Chest: Effort normal.  Abdominal: Soft. There is no tenderness.  Tenderness across the mid suprapubic  Genitourinary:  Normal external female genitalia. There is a small amount of slightly brown, bubbly discharge surrounding the cervix. Ectocervix without lesions but is friable. Bimanual: Positive CMT and left adnexal tenderness.  Neurological: She is alert and oriented to person, place, and time. She exhibits normal muscle tone.  Skin: Skin is warm and dry.  Nursing note and  vitals reviewed.   ED Course  Procedures (including critical care time) Labs Review Labs Reviewed  POCT URINALYSIS DIP (DEVICE) - Abnormal; Notable for the following:    Hgb urine dipstick SMALL (*)    Leukocytes, UA SMALL (*)    All other components within normal limits  POCT PREGNANCY, URINE  CERVICOVAGINAL ANCILLARY ONLY   Results for orders placed or performed during the hospital encounter of 05/26/14  POCT urinalysis dip (device)  Result Value Ref Range   Glucose, UA NEGATIVE NEGATIVE  mg/dL   Bilirubin Urine NEGATIVE NEGATIVE   Ketones, ur NEGATIVE NEGATIVE mg/dL   Specific Gravity, Urine 1.025 1.005 - 1.030   Hgb urine dipstick SMALL (A) NEGATIVE   pH 7.0 5.0 - 8.0   Protein, ur NEGATIVE NEGATIVE mg/dL   Urobilinogen, UA 1.0 0.0 - 1.0 mg/dL   Nitrite NEGATIVE NEGATIVE   Leukocytes, UA SMALL (A) NEGATIVE  Pregnancy, urine POC  Result Value Ref Range   Preg Test, Ur NEGATIVE NEGATIVE    Imaging Review No results found.   MDM   1. PID (acute pelvic inflammatory disease)   2. Vaginal discharge   3. Vaginitis    Rocephin 250 mg IM azithro 1 gm po Flagyl bid cerv cytology pending.    Hayden Rasmussenavid Elga Santy, NP 05/26/14 1112

## 2014-05-26 NOTE — Discharge Instructions (Signed)
Bacterial Vaginosis °Bacterial vaginosis is a vaginal infection that occurs when the normal balance of bacteria in the vagina is disrupted. It results from an overgrowth of certain bacteria. This is the most common vaginal infection in women of childbearing age. Treatment is important to prevent complications, especially in pregnant women, as it can cause a premature delivery. °CAUSES  °Bacterial vaginosis is caused by an increase in harmful bacteria that are normally present in smaller amounts in the vagina. Several different kinds of bacteria can cause bacterial vaginosis. However, the reason that the condition develops is not fully understood. °RISK FACTORS °Certain activities or behaviors can put you at an increased risk of developing bacterial vaginosis, including: °· Having a new sex partner or multiple sex partners. °· Douching. °· Using an intrauterine device (IUD) for contraception. °Women do not get bacterial vaginosis from toilet seats, bedding, swimming pools, or contact with objects around them. °SIGNS AND SYMPTOMS  °Some women with bacterial vaginosis have no signs or symptoms. Common symptoms include: °· Grey vaginal discharge. °· A fishlike odor with discharge, especially after sexual intercourse. °· Itching or burning of the vagina and vulva. °· Burning or pain with urination. °DIAGNOSIS  °Your health care provider will take a medical history and examine the vagina for signs of bacterial vaginosis. A sample of vaginal fluid may be taken. Your health care provider will look at this sample under a microscope to check for bacteria and abnormal cells. A vaginal pH test may also be done.  °TREATMENT  °Bacterial vaginosis may be treated with antibiotic medicines. These may be given in the form of a pill or a vaginal cream. A second round of antibiotics may be prescribed if the condition comes back after treatment.  °HOME CARE INSTRUCTIONS  °· Only take over-the-counter or prescription medicines as  directed by your health care provider. °· If antibiotic medicine was prescribed, take it as directed. Make sure you finish it even if you start to feel better. °· Do not have sex until treatment is completed. °· Tell all sexual partners that you have a vaginal infection. They should see their health care provider and be treated if they have problems, such as a mild rash or itching. °· Practice safe sex by using condoms and only having one sex partner. °SEEK MEDICAL CARE IF:  °· Your symptoms are not improving after 3 days of treatment. °· You have increased discharge or pain. °· You have a fever. °MAKE SURE YOU:  °· Understand these instructions. °· Will watch your condition. °· Will get help right away if you are not doing well or get worse. °FOR MORE INFORMATION  °Centers for Disease Control and Prevention, Division of STD Prevention: www.cdc.gov/std °American Sexual Health Association (ASHA): www.ashastd.org  °Document Released: 01/29/2005 Document Revised: 11/19/2012 Document Reviewed: 09/10/2012 °ExitCare® Patient Information ©2015 [employer]. This information is not intended to replace advice given to you by your health care provider. Make sure you discuss any questions you have with your health care provider. ° °Pelvic Inflammatory Disease °Pelvic inflammatory disease (PID) refers to an infection in some or all of the female organs. The infection can be in the uterus, ovaries, fallopian tubes, or the surrounding tissues in the pelvis. PID can cause abdominal or pelvic pain that comes on suddenly (acute pelvic pain). PID is a serious infection because it can lead to lasting (chronic) pelvic pain or the inability to have children (infertile).  °CAUSES  °The infection is often caused by the normal bacteria found   in the vaginal tissues. PID may also be caused by an infection that is spread during sexual contact. PID can also occur following:   The birth of a baby.   A miscarriage.   An abortion.    Major pelvic surgery.   The use of an intrauterine device (IUD).   A sexual assault.  RISK FACTORS Certain factors can put a person at higher risk for PID, such as:  Being younger than 25 years.  Being sexually active at [place] age.  Usingnonbarrier contraception.  Havingmultiple sexual partners.  Having sex with someone who has symptoms of a genital infection.  Using oral contraception. Other times, certain behaviors can increase the possibility of getting PID, such as:  Having sex during your period.  Using a vaginal douche.  Having an intrauterine device (IUD) in place. SYMPTOMS   Abdominal or pelvic pain.   Fever.   Chills.   Abnormal vaginal discharge.  Abnormal uterine bleeding.   Unusual pain shortly after finishing your period. DIAGNOSIS  Your caregiver will choose some of the following methods to make a diagnosis, such as:   Performinga physical exam and history. A pelvic exam typically reveals a very tender uterus and surrounding pelvis.   Ordering laboratory tests including a pregnancy test, blood tests, and urine test.  Orderingcultures of the vagina and cervix to check for a sexually transmitted infection (STI).  Performing an ultrasound.   Performing a laparoscopic procedure to look inside the pelvis.  TREATMENT   Antibiotic medicines may be prescribed and taken by mouth.   Sexual partners may be treated when the infection is caused by a sexually transmitted disease (STD).   Hospitalization may be needed to give antibiotics intravenously.  Surgery may be needed, but this is rare. It may take weeks until you are completely well. If you are diagnosed with PID, you should also be checked for human immunodeficiency virus (HIV). HOME CARE INSTRUCTIONS   If given, take your antibiotics as directed. Finish the medicine even if you start to feel better.   Only take over-the-counter or prescription medicines for pain,  discomfort, or fever as directed by your caregiver.   Do not have sexual intercourse until treatment is completed or as directed by your caregiver. If PID is confirmed, your recent sexual partner(s) will need treatment.   Keep your follow-up appointments. SEEK MEDICAL CARE IF:   You have increased or abnormal vaginal discharge.   You need prescription medicine for your pain.   You vomit.   You cannot take your medicines.   Your partner has an STD.  SEEK IMMEDIATE MEDICAL CARE IF:   You have a fever.   You have increased abdominal or pelvic pain.   You have chills.   You have pain when you urinate.   You are not better after 72 hours following treatment.  MAKE SURE YOU:   Understand these instructions.  Will watch your condition.  Will get help right away if you are not doing well or get worse. Document Released: 01/29/2005 Document Revised: 05/26/2012 Document Reviewed: 01/25/2011 [facility] Patient Information 2015 [place], [place]. This information is not intended to replace advice given to you by your health care provider. Make sure you discuss any questions you have with your health care provider.  Cervicitis Cervicitis is a soreness and swelling (inflammation) of the cervix. Your cervix is located at the bottom of your uterus. It opens up to the vagina. CAUSES   Sexually transmitted infections (STIs).   Allergic reaction.  Medicines or birth control devices that are put in the vagina.   Injury to the cervix.   Bacterial infections.  RISK FACTORS You are at greater risk if you:  Have unprotected sexual intercourse.  Have sexual intercourse with many partners.  Began sexual intercourse at an early age.  Have a history of STIs. SYMPTOMS  There may be no symptoms. If symptoms occur, they may include:   Gray, white, yellow, or bad-smelling vaginal discharge.   Pain or itching of the area outside the vagina.   Painful sexual  intercourse.   Lower abdominal or lower back pain, especially during intercourse.   Frequent urination.   Abnormal vaginal bleeding between periods, after sexual intercourse, or after menopause.   Pressure or a heavy feeling in the pelvis.  DIAGNOSIS  Diagnosis is made after a pelvic exam. Other tests may include:   Examination of any discharge under a microscope (wet prep).   A Pap test.  TREATMENT  Treatment will depend on the cause of cervicitis. If it is caused by an STI, both you and your partner will need to be treated. Antibiotic medicines will be given.  HOME CARE INSTRUCTIONS   Do not have sexual intercourse until your health care provider says it is okay.   Do not have sexual intercourse until your partner has been treated, if your cervicitis is caused by an STI.   Take your antibiotics as directed. Finish them even if you start to feel better.  SEEK MEDICAL CARE IF:  Your symptoms come back.   You have a fever.  MAKE SURE YOU:   Understand these instructions.  Will watch your condition.  Will get help right away if you are not doing well or get worse. Document Released: 01/29/2005 Document Revised: 02/03/2013 Document Reviewed: 07/23/2012 [facility] Patient Information 2015 [place], [place]. This information is not intended to replace advice given to you by your health care provider. Make sure you discuss any questions you have with your health care provider.  Vaginitis Vaginitis is an inflammation of the vagina. It is most often caused by a change in the normal balance of the bacteria and yeast that live in the vagina. This change in balance causes an overgrowth of certain bacteria or yeast, which causes the inflammation. There are different types of vaginitis, but the most common types are:  Bacterial vaginosis.  Yeast infection (candidiasis).  Trichomoniasis vaginitis. This is a sexually transmitted infection (STI).  Viral vaginitis.  Atropic  vaginitis.  Allergic vaginitis. CAUSES  The cause depends on the type of vaginitis. Vaginitis can be caused by:  Bacteria (bacterial vaginosis).  Yeast (yeast infection).  A parasite (trichomoniasis vaginitis)  A virus (viral vaginitis).  Low hormone levels (atrophic vaginitis). Low hormone levels can occur during pregnancy, breastfeeding, or after menopause.  Irritants, such as bubble baths, scented tampons, and feminine sprays (allergic vaginitis). Other factors can change the normal balance of the yeast and bacteria that live in the vagina. These include:  Antibiotic medicines.  Poor hygiene.  Diaphragms, vaginal sponges, spermicides, birth control pills, and intrauterine devices (IUD).  Sexual intercourse.  Infection.  Uncontrolled diabetes.  A weakened immune system. SYMPTOMS  Symptoms can vary depending on the cause of the vaginitis. Common symptoms include:  Abnormal vaginal discharge.  The discharge is white, gray, or yellow with bacterial vaginosis.  The discharge is thick, white, and cheesy with a yeast infection.  The discharge is frothy and yellow or greenish with trichomoniasis.  A bad vaginal odor.  The odor is fishy with bacterial vaginosis.  Vaginal itching, pain, or swelling.  Painful intercourse.  Pain or burning when urinating. Sometimes, there are no symptoms. TREATMENT  Treatment will vary depending on the type of infection.   Bacterial vaginosis and trichomoniasis are often treated with antibiotic creams or pills.  Yeast infections are often treated with antifungal medicines, such as vaginal creams or suppositories.  Viral vaginitis has no cure, but symptoms can be treated with medicines that relieve discomfort. Your sexual partner should be treated as well.  Atrophic vaginitis may be treated with an estrogen cream, pill, suppository, or vaginal ring. If vaginal dryness occurs, lubricants and moisturizing creams may help. You may be  told to avoid scented soaps, sprays, or douches.  Allergic vaginitis treatment involves quitting the use of the product that is causing the problem. Vaginal creams can be used to treat the symptoms. HOME CARE INSTRUCTIONS   Take all medicines as directed by your caregiver.  Keep your genital area clean and dry. Avoid soap and only rinse the area with water.  Avoid douching. It can remove the healthy bacteria in the vagina.  Do not use tampons or have sexual intercourse until your vaginitis has been treated. Use sanitary pads while you have vaginitis.  Wipe from front to back. This avoids the spread of bacteria from the rectum to the vagina.  Let air reach your genital area.  Wear cotton underwear to decrease moisture buildup.  Avoid wearing underwear while you sleep until your vaginitis is gone.  Avoid tight pants and underwear or nylons without a cotton panel.  Take off wet clothing (especially bathing suits) as soon as possible.  Use mild, non-scented products. Avoid using irritants, such as:  Scented feminine sprays.  Fabric softeners.  Scented detergents.  Scented tampons.  Scented soaps or bubble baths.  Practice safe sex and use condoms. Condoms may prevent the spread of trichomoniasis and viral vaginitis. SEEK MEDICAL CARE IF:   You have abdominal pain.  You have a fever or persistent symptoms for more than 2-3 days.  You have a fever and your symptoms suddenly get worse. Document Released: 11/26/2006 Document Revised: 10/24/2011 Document Reviewed: 07/12/2011 [facility] Patient Information 2015 [place]. This information is not intended to replace advice given to you by your health care provider. Make sure you discuss any questions you have with your health care provider.

## 2014-05-26 NOTE — ED Notes (Signed)
Patient c/o nausea, given medication for same

## 2014-05-26 NOTE — ED Notes (Signed)
Concern for STD. C/o grey vaginal d/c and odor x 2 weeks. NAD

## 2014-05-27 LAB — CERVICOVAGINAL ANCILLARY ONLY
CHLAMYDIA, DNA PROBE: NEGATIVE
NEISSERIA GONORRHEA: NEGATIVE

## 2014-05-28 LAB — CERVICOVAGINAL ANCILLARY ONLY: WET PREP (BD AFFIRM): POSITIVE — AB

## 2014-05-30 NOTE — ED Notes (Signed)
Final reports of STD screenings available for review. Negative for GC and Chlamydia, positive for gardnerella

## 2014-05-30 NOTE — ED Notes (Signed)
Lab reports positive for gardnerella and trichamonis. Treated w Flagyl. treatmet adequate. Waiting for return call from patient to advise of findings

## 2014-05-30 NOTE — ED Notes (Signed)
Spoke w patient. After confirming ID, discussed her lab findings. She will completed medications as written, and her partner will be advised , so he may also be treated for infection. No further action required

## 2014-05-31 ENCOUNTER — Telehealth (HOSPITAL_COMMUNITY): Payer: Self-pay | Admitting: *Deleted

## 2014-05-31 NOTE — ED Notes (Signed)
GC/Chlamydia neg., Affirm: Trich and Gardnerella pos., Candida neg.  I called pt. Pt. verified x 2 and given results.  Pt. told she was adequately treated for both infections with Flagyl.  Pt. instructed to notify her partner to be tested for Trich and  treated with Flagyl, no sex until she has finished her medication and her partner has been treated and to practice safe sex. Pt. told she  [facility], by appointment. Vassie MoselleYork, Cardarius Senat M 05/31/2014

## 2015-11-23 ENCOUNTER — Emergency Department (HOSPITAL_COMMUNITY)
Admission: EM | Admit: 2015-11-23 | Discharge: 2015-11-23 | Disposition: A | Discharge status: Home / Self Care | Payer: Medicaid Other | Attending: Emergency Medicine | Admitting: Emergency Medicine

## 2015-11-23 ENCOUNTER — Encounter (HOSPITAL_COMMUNITY): Payer: Self-pay

## 2015-11-23 DIAGNOSIS — Z87891 Personal history of nicotine dependence: Secondary | ICD-10-CM | POA: Insufficient documentation

## 2015-11-23 DIAGNOSIS — J020 Streptococcal pharyngitis: Secondary | ICD-10-CM | POA: Insufficient documentation

## 2015-11-23 DIAGNOSIS — J029 Acute pharyngitis, unspecified: Secondary | ICD-10-CM | POA: Diagnosis present

## 2015-11-23 LAB — POC URINE PREG, ED: PREG TEST UR: NEGATIVE

## 2015-11-23 LAB — RAPID STREP SCREEN (MED CTR MEBANE ONLY): Streptococcus, Group A Screen (Direct): POSITIVE — AB

## 2015-11-23 MED ORDER — AMOXICILLIN 500 MG PO CAPS: 500.0000 mg | ORAL_CAPSULE | Freq: Three times a day (TID) | ORAL | 0 refills | Status: DC
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
## 2015-11-23 NOTE — ED Triage Notes (Signed)
Pt presents with 4-5 day h/o cough, runny nose and sore throat.  Pt unsure of sick contact.

## 2016-03-22 ENCOUNTER — Emergency Department (HOSPITAL_COMMUNITY)
Admission: EM | Admit: 2016-03-22 | Discharge: 2016-03-22 | Disposition: A | Discharge status: Home / Self Care | Payer: Medicaid Other | Attending: Emergency Medicine | Admitting: Emergency Medicine

## 2016-03-22 ENCOUNTER — Encounter (HOSPITAL_COMMUNITY): Payer: Self-pay

## 2016-03-22 DIAGNOSIS — Y939 Activity, unspecified: Secondary | ICD-10-CM | POA: Insufficient documentation

## 2016-03-22 DIAGNOSIS — O9A211 Injury, poisoning and certain other consequences of external causes complicating pregnancy, first trimester: Secondary | ICD-10-CM | POA: Insufficient documentation

## 2016-03-22 DIAGNOSIS — Z23 Encounter for immunization: Secondary | ICD-10-CM | POA: Insufficient documentation

## 2016-03-22 DIAGNOSIS — Y929 Unspecified place or not applicable: Secondary | ICD-10-CM | POA: Insufficient documentation

## 2016-03-22 DIAGNOSIS — S21012A Laceration without foreign body of left breast, initial encounter: Secondary | ICD-10-CM | POA: Diagnosis not present

## 2016-03-22 DIAGNOSIS — S51812A Laceration without foreign body of left forearm, initial encounter: Secondary | ICD-10-CM | POA: Diagnosis not present

## 2016-03-22 DIAGNOSIS — Z3A09 9 weeks gestation of pregnancy: Secondary | ICD-10-CM | POA: Diagnosis not present

## 2016-03-22 DIAGNOSIS — Y999 Unspecified external cause status: Secondary | ICD-10-CM | POA: Diagnosis not present

## 2016-03-22 DIAGNOSIS — Z87891 Personal history of nicotine dependence: Secondary | ICD-10-CM | POA: Insufficient documentation

## 2016-03-22 DIAGNOSIS — T07XXXA Unspecified multiple injuries, initial encounter: Secondary | ICD-10-CM

## 2016-03-22 LAB — I-STAT BETA HCG BLOOD, ED (MC, WL, AP ONLY): I-stat hCG, quantitative: <5 m[IU]/mL (ref <5)

## 2016-03-22 MED ORDER — LIDOCAINE-EPINEPHRINE 1 %-1:100000 IJ SOLN
10.0000 mL | Freq: Once | INTRAMUSCULAR | Status: DC
Start: 2016-03-22 — End: 2016-03-22
  Filled 2016-03-22: qty 10

## 2016-03-22 MED ORDER — TETANUS-DIPHTH-ACELL PERTUSSIS 5-2.5-18.5 LF-MCG/0.5 IM SUSP
0.5000 mL | Freq: Once | INTRAMUSCULAR | Status: AC
  Administered 2016-03-22: 0.5 mL via INTRAMUSCULAR
  Filled 2016-03-22: qty 0.5

## 2016-03-22 MED ORDER — LIDOCAINE-EPINEPHRINE (PF) 1.5 %-1:200000 IJ SOLN
10.0000 mL | Freq: Once | INTRAMUSCULAR | Status: DC
  Filled 2016-03-22: qty 10

## 2016-03-22 MED ORDER — IBUPROFEN 400 MG PO TABS: 600.0000 mg | ORAL_TABLET | Freq: Once | ORAL | Status: DC

## 2016-03-22 MED ORDER — OXYCODONE-ACETAMINOPHEN 5-325 MG PO TABS
1.0000 | ORAL_TABLET | Freq: Once | ORAL | Status: AC
  Administered 2016-03-22: 1 via ORAL
  Filled 2016-03-22: qty 1
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
## 2016-03-22 NOTE — Discharge Instructions (Signed)
Follow-up as needed and for suture removal in 7-10 days.
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
## 2017-01-11 ENCOUNTER — Inpatient Hospital Stay (HOSPITAL_COMMUNITY): Admission: AD | Admit: 2017-01-11 | Payer: Self-pay | Source: Ambulatory Visit | Admitting: Obstetrics and Gynecology

## 2017-01-15 ENCOUNTER — Encounter: Payer: Self-pay | Admitting: Student

## 2017-01-15 ENCOUNTER — Ambulatory Visit (INDEPENDENT_AMBULATORY_CARE_PROVIDER_SITE_OTHER): Payer: Medicaid Other | Admitting: Student

## 2017-01-15 ENCOUNTER — Other Ambulatory Visit (HOSPITAL_COMMUNITY)
Admission: RE | Admit: 2017-01-15 | Discharge: 2017-01-15 | Disposition: A | Discharge status: Home / Self Care | Payer: Medicaid Other | Source: Ambulatory Visit | Attending: Student | Admitting: Student

## 2017-01-15 VITALS — BP 103/63 | HR 69 | Ht 66.0 in | Wt 186.9 lb

## 2017-01-15 DIAGNOSIS — Z3049 Encounter for surveillance of other contraceptives: Secondary | ICD-10-CM

## 2017-01-15 DIAGNOSIS — Z3202 Encounter for pregnancy test, result negative: Secondary | ICD-10-CM | POA: Diagnosis not present

## 2017-01-15 DIAGNOSIS — Z01419 Encounter for gynecological examination (general) (routine) without abnormal findings: Secondary | ICD-10-CM | POA: Insufficient documentation

## 2017-01-15 DIAGNOSIS — Z0000 Encounter for general adult medical examination without abnormal findings: Secondary | ICD-10-CM

## 2017-01-15 DIAGNOSIS — Z30017 Encounter for initial prescription of implantable subdermal contraceptive: Secondary | ICD-10-CM

## 2017-01-15 DIAGNOSIS — Z3046 Encounter for surveillance of implantable subdermal contraceptive: Secondary | ICD-10-CM

## 2017-01-15 DIAGNOSIS — Z113 Encounter for screening for infections with a predominantly sexual mode of transmission: Secondary | ICD-10-CM

## 2017-01-15 LAB — POCT PREGNANCY, URINE: Preg Test, Ur: NEGATIVE

## 2017-01-15 MED ORDER — ETONOGESTREL 68 MG ~~LOC~~ IMPL
68.0000 mg | DRUG_IMPLANT | Freq: Once | SUBCUTANEOUS | Status: AC
  Administered 2017-01-15: 68 mg via SUBCUTANEOUS
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
## 2017-01-15 NOTE — Patient Instructions (Signed)

## 2017-01-16 ENCOUNTER — Other Ambulatory Visit: Payer: Self-pay | Admitting: Student

## 2017-01-16 DIAGNOSIS — A549 Gonococcal infection, unspecified: Secondary | ICD-10-CM

## 2017-01-16 LAB — CYTOLOGY - PAP
BACTERIAL VAGINITIS: POSITIVE — AB
Candida vaginitis: NEGATIVE
Chlamydia: NEGATIVE
Diagnosis: NEGATIVE
NEISSERIA GONORRHEA: POSITIVE — AB
TRICH (WINDOWPATH): NEGATIVE

## 2017-01-16 LAB — HIV ANTIBODY (ROUTINE TESTING W REFLEX): HIV Screen 4th Generation wRfx: NONREACTIVE

## 2017-01-16 LAB — RPR: RPR: NONREACTIVE

## 2017-01-16 MED ORDER — CEFTRIAXONE SODIUM 250 MG IJ SOLR: 250.0000 mg | Freq: Once | INTRAMUSCULAR | Status: DC

## 2017-01-16 MED ORDER — AZITHROMYCIN 250 MG PO TABS: 1000.0000 mg | ORAL_TABLET | Freq: Once | ORAL | 0 refills | Status: AC

## 2017-01-17 ENCOUNTER — Telehealth: Payer: Self-pay

## 2017-01-18 ENCOUNTER — Telehealth: Payer: Self-pay

## 2017-01-18 NOTE — Telephone Encounter (Signed)
Called patient to inform her of her positive GC results. Advised patient to come in for her Rocephin injection. Patient states that she can come in on 01/21/17 at 1:30pm. Advised patient to reschedule if she is unable to make it. Patient verbalized understanding.

## 2017-01-21 ENCOUNTER — Ambulatory Visit: Payer: Medicaid Other

## 2017-01-25 ENCOUNTER — Telehealth: Payer: Self-pay | Admitting: Family Medicine

## 2017-01-25 ENCOUNTER — Encounter: Payer: Self-pay | Admitting: Family Medicine
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
## 2017-03-05 ENCOUNTER — Encounter: Payer: Self-pay | Admitting: *Deleted

## 2017-03-05 ENCOUNTER — Telehealth: Payer: Self-pay | Admitting: *Deleted
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
## 2017-03-07 NOTE — Telephone Encounter (Signed)
Called patient and discussed need to come in for treatment of gonorrhea. Offered 1/25 @ 11am appt. Patient confirmed and will be here. Patient had no questions

## 2017-03-08 ENCOUNTER — Ambulatory Visit (INDEPENDENT_AMBULATORY_CARE_PROVIDER_SITE_OTHER): Payer: Medicaid Other | Admitting: General Practice

## 2017-03-08 VITALS — BP 126/84 | Ht 66.0 in | Wt 191.0 lb

## 2017-03-08 DIAGNOSIS — A549 Gonococcal infection, unspecified: Secondary | ICD-10-CM | POA: Diagnosis present

## 2017-03-08 MED ORDER — CEFTRIAXONE SODIUM 250 MG IJ SOLR
250.0000 mg | Freq: Once | INTRAMUSCULAR | Status: AC
  Administered 2017-03-08: 250 mg via INTRAMUSCULAR

## 2017-03-08 MED ORDER — AZITHROMYCIN 250 MG PO TABS
1000.0000 mg | ORAL_TABLET | Freq: Every day | ORAL | Status: DC
  Administered 2017-03-08: 1000 mg via ORAL

## 2017-03-08 NOTE — Progress Notes (Signed)
Patient treated for gonorrhea

## 2017-12-09 ENCOUNTER — Emergency Department (HOSPITAL_BASED_OUTPATIENT_CLINIC_OR_DEPARTMENT_OTHER)
Admission: EM | Admit: 2017-12-09 | Discharge: 2017-12-09 | Disposition: A | Discharge status: Home / Self Care | Payer: Medicaid Other | Attending: Emergency Medicine | Admitting: Emergency Medicine

## 2017-12-09 ENCOUNTER — Other Ambulatory Visit: Payer: Self-pay

## 2017-12-09 ENCOUNTER — Encounter (HOSPITAL_BASED_OUTPATIENT_CLINIC_OR_DEPARTMENT_OTHER): Payer: Self-pay | Admitting: Emergency Medicine

## 2017-12-09 DIAGNOSIS — F1210 Cannabis abuse, uncomplicated: Secondary | ICD-10-CM | POA: Diagnosis not present

## 2017-12-09 DIAGNOSIS — R509 Fever, unspecified: Secondary | ICD-10-CM | POA: Insufficient documentation

## 2017-12-09 DIAGNOSIS — M7918 Myalgia, other site: Secondary | ICD-10-CM | POA: Diagnosis not present

## 2017-12-09 DIAGNOSIS — R070 Pain in throat: Secondary | ICD-10-CM | POA: Insufficient documentation

## 2017-12-09 DIAGNOSIS — F17210 Nicotine dependence, cigarettes, uncomplicated: Secondary | ICD-10-CM | POA: Diagnosis not present

## 2017-12-09 DIAGNOSIS — R05 Cough: Secondary | ICD-10-CM | POA: Diagnosis not present

## 2017-12-09 DIAGNOSIS — J3489 Other specified disorders of nose and nasal sinuses: Secondary | ICD-10-CM | POA: Diagnosis not present

## 2017-12-09 DIAGNOSIS — J069 Acute upper respiratory infection, unspecified: Secondary | ICD-10-CM | POA: Diagnosis not present

## 2017-12-09 DIAGNOSIS — B9789 Other viral agents as the cause of diseases classified elsewhere: Secondary | ICD-10-CM | POA: Insufficient documentation

## 2017-12-09 DIAGNOSIS — R0981 Nasal congestion: Secondary | ICD-10-CM | POA: Diagnosis present

## 2017-12-09 NOTE — ED Triage Notes (Signed)
Head congestion, runny nose, HA and scratchy throat since last night.  Sts her work sent her home early "to see someone". Will need work note.

## 2017-12-09 NOTE — Discharge Instructions (Signed)
Your symptoms are most likely from a virus that has caused an upper respiratory infection.  A viral illness typically peaks on day 2-3 and resolves after one week.    The main treatment approach for a viral upper respiratory infection is to treat the symptoms, support your immune system and prevent spread of illness.   Stay well-hydrated. Rest. You can use over the counter medications to help with symptoms: 600 mg ibuprofen (motrin, aleve, advil) or acetaminophen (tylenol) every 6 hours, around the clock to help with associated fevers, sore throat, headaches, generalized body aches and malaise.  Oxymetazoline (afrin) intranasal spray once daily for no more than 3 days to help with congestion, after 3 days you can switch to another over-the-counter nasal steroid spray such as fluticasone (flonase) Allergy medication (loratadine, cetirizine, etc) and phenylephrine (sudafed) help with nasal congestion, runny nose and postnasal drip.   Dextromethorphan (Delsym) to suppress cough without mucus Guaifenesin (mucinex) to help with built up mucus in chest and productive cough Wash your hands often to prevent spread.   A viral upper respiratory infection can also worsen and progress into pneumonia.  Return to the ER for fever greater than 100.21F, chest pain, shortness of breath, productive cough
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
## 2017-12-09 NOTE — ED Notes (Signed)
C/o cough, congestion itchy throat slight cough onset last pm  Has not taken any otc meds

## 2019-01-27 ENCOUNTER — Emergency Department (HOSPITAL_COMMUNITY)
Admission: EM | Admit: 2019-01-27 | Discharge: 2019-01-27 | Disposition: A | Discharge status: Home / Self Care | Payer: Self-pay | Attending: Emergency Medicine | Admitting: Emergency Medicine

## 2019-01-27 ENCOUNTER — Emergency Department (HOSPITAL_COMMUNITY): Payer: Self-pay

## 2019-01-27 ENCOUNTER — Other Ambulatory Visit: Payer: Self-pay

## 2019-01-27 DIAGNOSIS — F17210 Nicotine dependence, cigarettes, uncomplicated: Secondary | ICD-10-CM | POA: Insufficient documentation

## 2019-01-27 DIAGNOSIS — Z20828 Contact with and (suspected) exposure to other viral communicable diseases: Secondary | ICD-10-CM | POA: Insufficient documentation

## 2019-01-27 DIAGNOSIS — R52 Pain, unspecified: Secondary | ICD-10-CM

## 2019-01-27 DIAGNOSIS — R438 Other disturbances of smell and taste: Secondary | ICD-10-CM | POA: Insufficient documentation

## 2019-01-27 DIAGNOSIS — Z20822 Contact with and (suspected) exposure to covid-19: Secondary | ICD-10-CM

## 2019-01-27 DIAGNOSIS — B349 Viral infection, unspecified: Secondary | ICD-10-CM | POA: Insufficient documentation

## 2019-01-27 LAB — CBC WITH DIFFERENTIAL/PLATELET
Abs Immature Granulocytes: 0.02 10*3/uL (ref 0.00–0.07)
Basophils Absolute: 0.1 10*3/uL (ref 0.0–0.1)
Basophils Relative: 1 %
Eosinophils Absolute: 0.4 10*3/uL (ref 0.0–0.5)
Eosinophils Relative: 5 %
HCT: 43.5 % (ref 36.0–46.0)
Hemoglobin: 13.9 g/dL (ref 12.0–15.0)
Immature Granulocytes: 0 %
Lymphocytes Relative: 42 %
Lymphs Abs: 3.2 10*3/uL (ref 0.7–4.0)
MCH: 32.3 pg (ref 26.0–34.0)
MCHC: 32 g/dL (ref 30.0–36.0)
MCV: 100.9 fL — ABNORMAL HIGH (ref 80.0–100.0)
Monocytes Absolute: 0.4 10*3/uL (ref 0.1–1.0)
Monocytes Relative: 6 %
Neutro Abs: 3.5 10*3/uL (ref 1.7–7.7)
Neutrophils Relative %: 46 %
Platelets: 255 10*3/uL (ref 150–400)
RBC: 4.31 MIL/uL (ref 3.87–5.11)
RDW: 13.3 % (ref 11.5–15.5)
WBC: 7.6 10*3/uL (ref 4.0–10.5)
nRBC: 0 % (ref 0.0–0.2)

## 2019-01-27 LAB — COMPREHENSIVE METABOLIC PANEL
ALT: 17 U/L (ref 0–44)
AST: 14 U/L — ABNORMAL LOW (ref 15–41)
Albumin: 3.5 g/dL (ref 3.5–5.0)
Alkaline Phosphatase: 69 U/L (ref 38–126)
Anion gap: 12 (ref 5–15)
BUN: 9 mg/dL (ref 6–20)
CO2: 20 mmol/L — ABNORMAL LOW (ref 22–32)
Calcium: 8.9 mg/dL (ref 8.9–10.3)
Chloride: 108 mmol/L (ref 98–111)
Creatinine, Ser: 1 mg/dL (ref 0.44–1.00)
GFR calc Af Amer: >60 mL/min (ref >60)
GFR calc non Af Amer: >60 mL/min (ref >60)
Glucose, Bld: 87 mg/dL (ref 70–99)
Potassium: 3.8 mmol/L (ref 3.5–5.1)
Sodium: 140 mmol/L (ref 135–145)
Total Bilirubin: 0.7 mg/dL (ref 0.3–1.2)
Total Protein: 7 g/dL (ref 6.5–8.1)

## 2019-01-27 LAB — LACTIC ACID, PLASMA: Lactic Acid, Venous: 1.5 mmol/L (ref 0.5–1.9)

## 2019-01-27 LAB — I-STAT BETA HCG BLOOD, ED (MC, WL, AP ONLY): I-stat hCG, quantitative: <5 m[IU]/mL (ref <5)

## 2019-01-27 LAB — SARS CORONAVIRUS 2 (TAT 6-24 HRS): SARS Coronavirus 2: NEGATIVE

## 2019-01-27 MED ORDER — SODIUM CHLORIDE 0.9% FLUSH: 3.0000 mL | Freq: Once | INTRAVENOUS | Status: DC
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
## 2019-01-27 NOTE — ED Notes (Signed)
Pt verbalized understanding of discharge instructions. COVID swab completed, pt informed on follow up care, and viral symptoms management. Pt had no further questions

## 2019-01-27 NOTE — ED Triage Notes (Signed)
Pt reports waking with generalized bodyaches. Denies fever, cough. Reports some sore throat. States roomates COVID+. VSS.

## 2019-01-27 NOTE — Discharge Instructions (Signed)
You were tested for Covid while in the emergency department.  You may take Tylenol and ibuprofen as needed for pain.  Make sure to rest and drink plenty of fluids.  If you develop any new worsening symptoms such as chest pain, severe shortness of breath, coughing up blood, unilateral leg swelling, inability to tolerate liquid intake please seek reevaluation emergency department.  Will need to stay out of work until your Covid test has resulted however given your close contact I would recommend you self isolating at home for 10 days

## 2020-03-03 ENCOUNTER — Encounter: Payer: Self-pay | Admitting: Student

## 2020-03-03 ENCOUNTER — Other Ambulatory Visit (HOSPITAL_COMMUNITY)
Admission: RE | Admit: 2020-03-03 | Discharge: 2020-03-03 | Disposition: A | Discharge status: Home / Self Care | Payer: Self-pay | Source: Ambulatory Visit | Attending: Student | Admitting: Student

## 2020-03-03 ENCOUNTER — Other Ambulatory Visit (INDEPENDENT_AMBULATORY_CARE_PROVIDER_SITE_OTHER): Payer: Self-pay

## 2020-03-03 ENCOUNTER — Ambulatory Visit (INDEPENDENT_AMBULATORY_CARE_PROVIDER_SITE_OTHER): Payer: Self-pay | Admitting: Student

## 2020-03-03 ENCOUNTER — Other Ambulatory Visit: Payer: Self-pay

## 2020-03-03 VITALS — BP 131/88 | HR 91 | Ht 67.0 in | Wt 216.6 lb

## 2020-03-03 DIAGNOSIS — Z01419 Encounter for gynecological examination (general) (routine) without abnormal findings: Secondary | ICD-10-CM

## 2020-03-03 DIAGNOSIS — Z124 Encounter for screening for malignant neoplasm of cervix: Secondary | ICD-10-CM

## 2020-03-03 DIAGNOSIS — Z3046 Encounter for surveillance of implantable subdermal contraceptive: Secondary | ICD-10-CM

## 2020-03-03 DIAGNOSIS — N898 Other specified noninflammatory disorders of vagina: Secondary | ICD-10-CM | POA: Insufficient documentation

## 2020-03-03 MED ORDER — FLUCONAZOLE 150 MG PO TABS: 150.0000 mg | ORAL_TABLET | Freq: Once | ORAL | 1 refills | Status: AC

## 2020-03-03 NOTE — Progress Notes (Signed)
°  History:  Ms. ELIABETH SHOFF is a 30 y.o. [id number] who presents to clinic today for NExplanon removal and pap smear. She also endorses some white-green runny discharge with an odor. It has been going on since last week. No itching, no burning with urination. No pain. She would like to be tested for STIs, yeast, BV.   The following portions of the patient's history were reviewed and updated as appropriate: allergies, current medications, family history, past medical history, social history, past surgical history and problem list.  Review of Systems:  Review of Systems  Constitutional: Negative.   HENT: Negative.   Genitourinary: Negative.   Musculoskeletal: Negative for myalgias.  Skin: Negative.   Neurological: Negative.   Psychiatric/Behavioral: Negative.   Reports runny white and green discharge; no fishlike odor, no itching, no clumpy discharge. This started last week. It is not normal for her.     Objective:  Physical Exam BP 131/88    Pulse 91    Ht 5\' 7"  (1.702 m)    Wt 216 lb 9.6 oz (98.2 kg)    BMI 33.92 kg/m  Physical Exam Constitutional:      Appearance: Normal appearance.  Pulmonary:     Effort: Pulmonary effort is normal.  Abdominal:     General: Abdomen is flat.  Genitourinary:    General: Normal vulva.     Vagina: Vaginal discharge present.     Comments: Clumpy white discharge as well as copious mucous like white and light green discharge in the vagina Musculoskeletal:        General: Normal range of motion.  Skin:    General: Skin is warm.  Neurological:     Mental Status: She is alert.       Labs and Imaging No results found for this or any previous visit (from the past 24 hour(s)).  No results found.   Assessment & Plan:  1. Well female exam with routine gynecological exam  - Cytology - PAP( [place])  2. Discharge from the vagina  - fluconazole (DIFLUCAN) 150 MG tablet; Take 1 tablet (150 mg total) by mouth once for 1 dose.  Dispense: 1  tablet; Refill: 1 - Cervicovaginal ancillary only( [facility])    Approximately 25 minutes of total time was spent with this patient on counseling, pap and nexplanon removal.   , CNM 03/04/2020 7:39 AM      GYNECOLOGY OFFICE PROCEDURE NOTE  RAELIN PIXLER is a 30 y.o. 27 here for Nexplanon removal. Last pap smear was on 01/2017 and patient was positive for Gonorrhea; pap cytology normal.   No other gynecologic concerns.  Nexplanon Removal Patient identified, informed consent performed, consent signed.   Appropriate time out taken. Nexplanon site identified.  Area prepped in usual sterile fashon. One ml of 1% lidocaine was used to anesthetize the area at the distal end of the implant. A small stab incision was made right beside the implant on the distal portion.  The Nexplanon rod was grasped using hemostats and removed without difficulty.  There was minimal blood loss. There were no complications.  3 ml of 1% lidocaine was injected around the incision for post-procedure analgesia.  Steri-strips were applied over the small incision.  A pressure bandage was applied to reduce any bruising.  The patient tolerated the procedure well and was given post procedure instructions.  Patient is planning to use nothng for contraception/attempt conception.

## 2020-03-04 LAB — CERVICOVAGINAL ANCILLARY ONLY
Bacterial Vaginitis (gardnerella): POSITIVE — AB
Candida Glabrata: NEGATIVE
Candida Vaginitis: NEGATIVE
Chlamydia: NEGATIVE
Comment: NEGATIVE
Comment: NEGATIVE
Comment: NEGATIVE
Comment: NEGATIVE
Comment: NEGATIVE
Comment: NORMAL
Neisseria Gonorrhea: NEGATIVE
Trichomonas: POSITIVE — AB

## 2020-03-05 ENCOUNTER — Encounter: Payer: Self-pay | Admitting: Student

## 2020-03-05 DIAGNOSIS — A599 Trichomoniasis, unspecified: Secondary | ICD-10-CM | POA: Insufficient documentation

## 2020-03-08 LAB — CYTOLOGY - PAP
Comment: NEGATIVE
Diagnosis: UNDETERMINED — AB
High risk HPV: POSITIVE — AB

## 2020-03-10 ENCOUNTER — Telehealth: Payer: Self-pay | Admitting: *Deleted

## 2020-03-10 ENCOUNTER — Encounter: Payer: Self-pay | Admitting: Student

## 2020-03-10 DIAGNOSIS — R87810 Cervical high risk human papillomavirus (HPV) DNA test positive: Secondary | ICD-10-CM | POA: Insufficient documentation

## 2020-03-10 DIAGNOSIS — R87610 Atypical squamous cells of undetermined significance on cytologic smear of cervix (ASC-US): Secondary | ICD-10-CM | POA: Insufficient documentation

## 2020-03-10 NOTE — Telephone Encounter (Addendum)
-----   Message from Marylene Land, CNM sent at 03/10/2020  3:52 PM EST ----- Hi! This patient needs a colpo; do you mind to call her and set it up? I've sent her a MyChart message about it. Thank you so much!  1/27  1722 Per chart review, pt has not checked recent Mychart messages. I called pt and she did not answer. Her mailbox was full and I was not able to leave message. Scheduling staff was notified of need for Colpo appt.

## 2020-03-15 NOTE — Telephone Encounter (Signed)
Called patient, no answer- left message to call us back concerning test results. 

## 2020-03-15 NOTE — Telephone Encounter (Signed)
Letter sent due to several attempts in trying to reach pt and unsuccessful.    Addison Naegeli, RN

## 2020-03-18 ENCOUNTER — Telehealth: Payer: Self-pay

## 2020-03-18 NOTE — Telephone Encounter (Signed)
Telephoned patient at home number. Left a voice message with [employer] contact information. 

## 2020-03-21 ENCOUNTER — Telehealth: Payer: Self-pay | Admitting: Family Medicine

## 2020-03-21 NOTE — Telephone Encounter (Signed)
I spoke with pt on 03-18-20 and advised that we need medicaid # or I could give her [employer] info. Pt states she was driving and will call me back with [employer] ID#.  I advised and pt understood that if no Medicaid or [employer] program that we will have to cx appt. I tried to call pt back today but no answer and auto line states no call can be processed at this time.

## 2020-03-21 NOTE — Telephone Encounter (Signed)
Telephoned patient at home number. Call cannot be completed at this time. [employer] unable to contact patient for scheduling.

## 2020-03-28 ENCOUNTER — Telehealth: Payer: Self-pay

## 2020-03-28 ENCOUNTER — Ambulatory Visit: Payer: Self-pay | Admitting: Obstetrics & Gynecology

## 2020-03-28 NOTE — Telephone Encounter (Signed)
Called pt to follow up on missed appt; VM left. MyChart message sent. °

## 2020-07-20 ENCOUNTER — Ambulatory Visit (HOSPITAL_COMMUNITY)
Admission: EM | Admit: 2020-07-20 | Discharge: 2020-07-20 | Disposition: A | Discharge status: Home / Self Care | Payer: Self-pay | Attending: Emergency Medicine | Admitting: Emergency Medicine

## 2020-07-20 ENCOUNTER — Other Ambulatory Visit: Payer: Self-pay

## 2020-07-20 ENCOUNTER — Encounter (HOSPITAL_COMMUNITY): Payer: Self-pay

## 2020-07-20 DIAGNOSIS — N939 Abnormal uterine and vaginal bleeding, unspecified: Secondary | ICD-10-CM

## 2020-07-20 MED ORDER — MEGESTROL ACETATE 40 MG PO TABS: 40.0000 mg | ORAL_TABLET | Freq: Every day | ORAL | 0 refills | Status: AC

## 2020-07-20 NOTE — Discharge Instructions (Signed)
Take medication as prescribed. Please call today to make an appointment with gynecology today for follow-up within the next 1 week. ED for worsening of symptoms.

## 2020-07-20 NOTE — ED Triage Notes (Signed)
Pt presents with complaints of vaginal bleeding since 5/23. Reports pad or tampon must be changed every 30 minutes. Patient reports clots are present in the blood. Pt denies any pain.

## 2020-07-20 NOTE — ED Provider Notes (Signed)
Subjective:    Lisa Byrd is a very pleasant 30 y.o. female who presents with concerns due to vaginal bleeding onset 5/23.  Patient reports having and continued blood clots.  She also reports having some abdominal cramping and discomfort to her vaginal region at times.  Patient states that she is having to change her pad/tampon about every 30 minutes and sometimes bleed through her clothing. Patient reports some fatigue. She is established with gynecology, but reports not being able to get in with them today. She reports having had her Nexplanon removed over the last 3-4 months. No irregular menses until 5/23. No unilateral back pain, vomiting, fever, vaginal discharge, concern for STD  Past medical history, past surgical history, current medications reviewed.  Allergies: has No Known Allergies.  Review of Systems See HPI   Objective:    BP 122/86   Pulse 85   Temp 98.9 F (37.2 C)   Resp 19   LMP 07/20/2020   SpO2 100%   Breastfeeding No   General: Appears well-developed and well-nourished. No acute distress.  Cardiovascular: Normal rate . Regular rhythm; no murmurs, gallops, or rubs.  Pulm/Chest: No respiratory distress. Breath sounds normal bilaterally without wheezes, rhonchi, or rales.  Abdominal: Soft, non-distended, and non-tender without rebound or guarding. No masses or hepatosplenomegaly. Neurological: Alert and oriented to person, place, and time.  Skin: Skin is warm and dry.  Psychiatric: Normal mood, affect, behavior, and thought content.  GU: Deferred  Laboratory:  No orders of the defined types were placed in this encounter.  No results found for any visits on 07/20/20.   Assessment:   1. Vaginal bleeding   Plan:   MDM: Patient presents with concerns due to vaginal bleeding onset 5/23.  Patient reports having and continued blood clots.  She also reports having some abdominal cramping and discomfort to her vaginal region at times.  Patient states that she  is having to change her pad/tampon about every 30 minutes and sometimes bleed through her clothing. Patient reports some fatigue. She is established with gynecology, but reports not being able to get in with them today. She reports having had her Nexplanon removed over the last 3-4 months. No irregular menses until 5/23. No unilateral back pain, vomiting, fever, vaginal discharge, concern for STD. Chart review completed. Given symptoms and duration, will RX Megace once daily for the next 14 days or until symptoms subside and advised close follow-up with gynecology team. Advised of at home care as outlined in her AVS. Return as needed. Patient stable upon discharge.  Patient verbalized understanding agreed with plan.  Discussed patient with attending Hagler who did not personally examine the patient and agrees with plan.    Discharge Instructions     Take medication as prescribed. Please call today to make an appointment with gynecology today for follow-up within the next 1 week. ED for worsening of symptoms.      Amalia Greenhouse, FNP-C 07/20/20   A copy of these instructions was provided to the patient or responsible parent/guardian, who expressed understanding and agreed with the treatment plan.  All questions addressed.  This note was partially made with the aid of speech-to-text dictation; typographical errors are not intentional.   Amalia Greenhouse, FNP 07/20/20 1031

## 2020-12-13 ENCOUNTER — Other Ambulatory Visit: Payer: Self-pay

## 2020-12-13 ENCOUNTER — Emergency Department (HOSPITAL_COMMUNITY)
Admission: EM | Admit: 2020-12-13 | Discharge: 2020-12-14 | Disposition: A | Discharge status: Home / Self Care | Payer: BC Managed Care – PPO | Attending: Emergency Medicine | Admitting: Emergency Medicine

## 2020-12-13 DIAGNOSIS — N939 Abnormal uterine and vaginal bleeding, unspecified: Secondary | ICD-10-CM | POA: Diagnosis present

## 2020-12-13 DIAGNOSIS — F17210 Nicotine dependence, cigarettes, uncomplicated: Secondary | ICD-10-CM | POA: Insufficient documentation

## 2020-12-13 DIAGNOSIS — N852 Hypertrophy of uterus: Secondary | ICD-10-CM

## 2020-12-13 DIAGNOSIS — R109 Unspecified abdominal pain: Secondary | ICD-10-CM | POA: Diagnosis not present

## 2020-12-13 LAB — BASIC METABOLIC PANEL
Anion gap: 6 (ref 5–15)
BUN: 15 mg/dL (ref 6–20)
CO2: 25 mmol/L (ref 22–32)
Calcium: 8.9 mg/dL (ref 8.9–10.3)
Chloride: 105 mmol/L (ref 98–111)
Creatinine, Ser: 1.02 mg/dL — ABNORMAL HIGH (ref 0.44–1.00)
GFR, Estimated: >60 mL/min (ref >60)
Glucose, Bld: 89 mg/dL (ref 70–99)
Potassium: 3.6 mmol/L (ref 3.5–5.1)
Sodium: 136 mmol/L (ref 135–145)

## 2020-12-13 LAB — CBC WITH DIFFERENTIAL/PLATELET
Abs Immature Granulocytes: 0.02 10*3/uL (ref 0.00–0.07)
Basophils Absolute: 0.1 10*3/uL (ref 0.0–0.1)
Basophils Relative: 1 %
Eosinophils Absolute: 0.5 10*3/uL (ref 0.0–0.5)
Eosinophils Relative: 5 %
HCT: 33.2 % — ABNORMAL LOW (ref 36.0–46.0)
Hemoglobin: 9.9 g/dL — ABNORMAL LOW (ref 12.0–15.0)
Immature Granulocytes: 0 %
Lymphocytes Relative: 40 %
Lymphs Abs: 4 10*3/uL (ref 0.7–4.0)
MCH: 26.8 pg (ref 26.0–34.0)
MCHC: 29.8 g/dL — ABNORMAL LOW (ref 30.0–36.0)
MCV: 89.7 fL (ref 80.0–100.0)
Monocytes Absolute: 0.5 10*3/uL (ref 0.1–1.0)
Monocytes Relative: 5 %
Neutro Abs: 4.8 10*3/uL (ref 1.7–7.7)
Neutrophils Relative %: 49 %
Platelets: 316 10*3/uL (ref 150–400)
RBC: 3.7 MIL/uL — ABNORMAL LOW (ref 3.87–5.11)
RDW: 17.6 % — ABNORMAL HIGH (ref 11.5–15.5)
WBC: 9.9 10*3/uL (ref 4.0–10.5)
nRBC: 0 % (ref 0.0–0.2)

## 2020-12-13 NOTE — ED Provider Notes (Signed)
Emergency Medicine Provider Triage Evaluation Note  Lisa Byrd , a 30 y.o. female  was evaluated in triage.  Pt complains of 3-week history of vaginal bleeding with clots.  She is typically very regular and gets her period on the first for a month.  However, this particular menstrual cycle came a week late.  Unsure whether or not she is pregnant.  She reports associated fatigue but denies any dyspnea on exertion, chest pain, or syncope  Review of Systems  Positive:  Negative: See above   Physical Exam  BP (!) 143/67 (BP Location: Right Arm)   Pulse 85   Temp 98.9 F (37.2 C) (Oral)   Resp 16   SpO2 94%  Gen:   Awake, no distress   Resp:  Normal effort  MSK:   Moves extremities without difficulty  Other:    Medical Decision Making  Medically screening exam initiated at 6:04 PM.  Appropriate orders placed.  Lisa [place] was informed that the remainder of the evaluation will be completed by another provider, this initial triage assessment does not replace that evaluation, and the importance of remaining in the ED until their evaluation is complete.     Honor Loh [place], PA-C 12/13/20 1805    Milagros Loll, MD 12/16/20 [phone]

## 2020-12-13 NOTE — ED Triage Notes (Signed)
Pt reports three weeks of vaginal bleeding with large clots. Endorses some menstrual cramps. Several negative pregnancy tests at home. This period was late and has now lasted for three weeks. Endorses fatigue.

## 2020-12-14 ENCOUNTER — Emergency Department (HOSPITAL_COMMUNITY): Payer: BC Managed Care – PPO

## 2020-12-14 LAB — POC URINE PREG, ED: Preg Test, Ur: NEGATIVE

## 2020-12-14 LAB — WET PREP, GENITAL
Clue Cells Wet Prep HPF POC: NONE SEEN
Sperm: NONE SEEN
Trich, Wet Prep: NONE SEEN
Yeast Wet Prep HPF POC: NONE SEEN

## 2020-12-14 MED ORDER — MEDROXYPROGESTERONE ACETATE 5 MG PO TABS: 5.0000 mg | ORAL_TABLET | Freq: Every day | ORAL | 0 refills | Status: DC

## 2020-12-14 MED ORDER — FERROUS SULFATE 325 (65 FE) MG PO TABS: 325.0000 mg | ORAL_TABLET | Freq: Every day | ORAL | 0 refills | Status: DC

## 2020-12-14 NOTE — ED Notes (Signed)
Need HCG urine   Gwyneth Sprout, MD 12/14/20 [phone]

## 2020-12-14 NOTE — Discharge Instructions (Addendum)
In addition to the Provera to help the bleeding stop we will give you a 1 month prescription for iron just to help bring your blood counts back to normal.  All the testing looks pretty normal here except for some thickening in your uterus.  That may go away just as the bleeding stops but will need to be followed up on.

## 2020-12-14 NOTE — ED Notes (Signed)
Patient transported to US 
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
## 2020-12-15 LAB — GC/CHLAMYDIA PROBE AMP (~~LOC~~) NOT AT ARMC
Chlamydia: NEGATIVE
Comment: NEGATIVE
Comment: NORMAL
Neisseria Gonorrhea: NEGATIVE

## 2021-01-11 ENCOUNTER — Ambulatory Visit: Payer: Self-pay | Admitting: Family Medicine

## 2022-04-13 ENCOUNTER — Inpatient Hospital Stay (HOSPITAL_BASED_OUTPATIENT_CLINIC_OR_DEPARTMENT_OTHER)
Admission: RE | Admit: 2022-04-13 | Payer: Commercial Managed Care - HMO | Source: Ambulatory Visit | Admitting: Radiology

## 2022-04-13 DIAGNOSIS — Z1231 Encounter for screening mammogram for malignant neoplasm of breast: Secondary | ICD-10-CM

## 2022-06-14 ENCOUNTER — Encounter: Payer: Self-pay | Admitting: Obstetrics and Gynecology

## 2022-06-14 ENCOUNTER — Other Ambulatory Visit (HOSPITAL_COMMUNITY)
Admission: RE | Admit: 2022-06-14 | Discharge: 2022-06-14 | Disposition: A | Discharge status: Home / Self Care | Payer: Commercial Managed Care - HMO | Source: Ambulatory Visit | Attending: Obstetrics and Gynecology | Admitting: Obstetrics and Gynecology

## 2022-06-14 ENCOUNTER — Ambulatory Visit (INDEPENDENT_AMBULATORY_CARE_PROVIDER_SITE_OTHER): Payer: Commercial Managed Care - HMO | Admitting: Obstetrics and Gynecology

## 2022-06-14 ENCOUNTER — Other Ambulatory Visit: Payer: Self-pay

## 2022-06-14 VITALS — BP 125/84 | HR 105 | Wt 232.2 lb

## 2022-06-14 DIAGNOSIS — Z01419 Encounter for gynecological examination (general) (routine) without abnormal findings: Secondary | ICD-10-CM

## 2022-06-14 DIAGNOSIS — Z124 Encounter for screening for malignant neoplasm of cervix: Secondary | ICD-10-CM | POA: Insufficient documentation

## 2022-06-14 DIAGNOSIS — Z113 Encounter for screening for infections with a predominantly sexual mode of transmission: Secondary | ICD-10-CM | POA: Diagnosis present

## 2022-06-14 NOTE — Progress Notes (Signed)
ANNUAL EXAM Patient name: Lisa Byrd MRN [id number]  Date of birth: 05/10/1990 Chief Complaint:   Gynecologic Exam  History of Present Illness:   Lisa Byrd is a 32 y.o. [id number] being seen today for a routine annual exam.  Current complaints: annual   Menstrual concerns? No   Breast or nipple changes? No  Contraception use? No  Sexually active? Yes   Has been sexually active w/ female partner x 8 years without pregnancy. Does not desire pregnancy but also not wanting contraception at this time  Patient's last menstrual period was 05/17/2022 (approximate).   Last pap     Component Value Date/Time   DIAGPAP (A) 03/03/2020 1601    - Atypical squamous cells of undetermined significance (ASC-US)   DIAGPAP  01/15/2017 0000    NEGATIVE FOR INTRAEPITHELIAL LESIONS OR MALIGNANCY.   HPVHIGH Positive (A) 03/03/2020 1601   ADEQPAP  03/03/2020 1601    Satisfactory for evaluation; transformation zone component PRESENT.   ADEQPAP  01/15/2017 0000    Satisfactory for evaluation  endocervical/transformation zone component PRESENT.    Last mammogram: n/a Last colonoscopy: n/a     03/03/2020    4:02 PM 01/15/2017    1:13 PM  Depression screen PHQ 2/9  Decreased Interest 0 2  Down, Depressed, Hopeless 0 1  PHQ - 2 Score 0 3  Altered sleeping 1 2  Tired, decreased energy 1 2  Change in appetite 0 3  Feeling bad or failure about yourself  0 2  Trouble concentrating 0 0  Moving slowly or fidgety/restless 0 0  Suicidal thoughts 0 0  PHQ-9 Score 2 12        03/03/2020    4:03 PM 01/15/2017    1:13 PM  GAD 7 : Generalized Anxiety Score  Nervous, Anxious, on Edge 3 1  Control/stop worrying 1 1  Worry too much - different things 2 1  Trouble relaxing 0 1  Restless 3 1  Easily annoyed or irritable 0 1  Afraid - awful might happen 0   Total GAD 7 Score 9      Review of Systems:   Pertinent items are noted in HPI Denies any headaches, blurred vision, fatigue, shortness of  breath, chest pain, abdominal pain, abnormal vaginal discharge/itching/odor/irritation, problems with periods, bowel movements, urination, or intercourse unless otherwise stated above. Pertinent History Reviewed:  Reviewed past medical,surgical, social and family history.  Reviewed problem list, medications and allergies. Physical Assessment:   Vitals:   06/14/22 0850  BP: 125/84  Pulse: (!) 105  Weight: 232 lb 3.2 oz (105.3 kg)  Body mass index is 36.37 kg/m.        Physical Examination:   General appearance - well appearing, and in no distress  Mental status - alert, oriented to person, place, and time  Psych:  She has a normal mood and affect  Skin - warm and dry, normal color, no suspicious lesions noted  Chest - effort normal  Breasts - breasts appear normal, no suspicious masses, no skin or nipple changes or  axillary nodes  Abdomen - soft, nontender, nondistended, no masses or organomegaly  Pelvic -  VULVA: normal appearing vulva with no masses, tenderness or lesions   VAGINA: normal appearing vagina with normal color and discharge, no lesions   CERVIX: difficult to visualize, anterior lip visualized and without lesion, small volume discharge in vaginal canal  Thin prep pap is done with HR HPV cotesting  Extremities:  No swelling or  varicosities noted  Chaperone present for exam  No results found for this or any previous visit (from the past 24 hour(s)).    Assessment & Plan:  1. Well woman exam with routine gynecological exam - Cervical cancer screening: Discussed guidelines. Pap with HPV collected - GC/CT: accepts - Birth Control:  none - Breast Health: Encouraged self breast awareness/SBE. Teaching provided.  - F/U 12 months and prn  - RPR+HBsAg+HCVAb+... - Cervicovaginal ancillary only  2. Screening for cervical cancer Last pap required colposcopy but did not show and has been 2 years since the last pap, collected repeat pap today and will followup  accordingly - Cytology - PAP  3. Screening examination for STI - RPR+HBsAg+HCVAb+... - Cervicovaginal ancillary only   Orders Placed This Encounter  Procedures   RPR+HBsAg+HCVAb+...    Meds: No orders of the defined types were placed in this encounter.   Follow-up: Return if symptoms worsen or fail to improve, for Annual GYN.  Lorriane Shire, MD 06/14/2022 9:12 AM

## 2022-06-15 ENCOUNTER — Other Ambulatory Visit: Payer: Self-pay | Admitting: Obstetrics and Gynecology

## 2022-06-15 DIAGNOSIS — B3731 Acute candidiasis of vulva and vagina: Secondary | ICD-10-CM

## 2022-06-15 DIAGNOSIS — A599 Trichomoniasis, unspecified: Secondary | ICD-10-CM

## 2022-06-15 DIAGNOSIS — B9689 Other specified bacterial agents as the cause of diseases classified elsewhere: Secondary | ICD-10-CM

## 2022-06-15 LAB — CERVICOVAGINAL ANCILLARY ONLY
Bacterial Vaginitis (gardnerella): POSITIVE — AB
Candida Glabrata: NEGATIVE
Candida Vaginitis: POSITIVE — AB
Chlamydia: NEGATIVE
Comment: NEGATIVE
Comment: NEGATIVE
Comment: NEGATIVE
Comment: NEGATIVE
Comment: NEGATIVE
Comment: NORMAL
Neisseria Gonorrhea: NEGATIVE
Trichomonas: POSITIVE — AB

## 2022-06-15 LAB — RPR+HBSAG+HCVAB+...
HIV Screen 4th Generation wRfx: NONREACTIVE
Hep C Virus Ab: NONREACTIVE
Hepatitis B Surface Ag: NEGATIVE
RPR Ser Ql: NONREACTIVE

## 2022-06-15 MED ORDER — FLUCONAZOLE 150 MG PO TABS: 150.0000 mg | ORAL_TABLET | Freq: Once | ORAL | 3 refills | Status: AC

## 2022-06-15 MED ORDER — METRONIDAZOLE 500 MG PO TABS: 500.0000 mg | ORAL_TABLET | Freq: Two times a day (BID) | ORAL | 0 refills | Status: DC

## 2022-06-18 MED ORDER — METRONIDAZOLE 500 MG PO TABS
500.0000 mg | ORAL_TABLET | Freq: Two times a day (BID) | ORAL | 0 refills | Status: DC
Start: 2022-06-18 — End: 2023-04-11

## 2022-06-18 NOTE — Addendum Note (Signed)
Addended by: Harlon Ditty on: 06/18/2022 08:40 AM   Modules accepted: Orders

## 2022-06-19 LAB — CYTOLOGY - PAP
Adequacy: ABSENT
Comment: NEGATIVE
Diagnosis: NEGATIVE
High risk HPV: NEGATIVE

## 2022-07-11 ENCOUNTER — Other Ambulatory Visit: Payer: Self-pay | Admitting: Obstetrics and Gynecology

## 2022-07-11 DIAGNOSIS — A599 Trichomoniasis, unspecified: Secondary | ICD-10-CM

## 2022-07-11 DIAGNOSIS — B9689 Other specified bacterial agents as the cause of diseases classified elsewhere: Secondary | ICD-10-CM

## 2022-07-16 IMAGING — US US PELVIS COMPLETE WITH TRANSVAGINAL
1 series · 13 of 25 positions shown · non-contrast
Comparison: Ob ultrasound 06/09/2012.

CLINICAL DATA: 30-year-old female with 3 weeks of vaginal bleeding.
Uterine enlargement.

EXAM:
TRANSABDOMINAL AND TRANSVAGINAL ULTRASOUND OF PELVIS
TECHNIQUE: Both transabdominal and transvaginal ultrasound examinations of the
pelvis were performed. Transabdominal technique was performed for
global imaging of the pelvis including uterus, ovaries, adnexal
regions, and pelvic cul-de-sac. It was necessary to proceed with
endovaginal exam following the transabdominal exam to visualize the
ovaries, endometrium.

[Series 1: us pelvic complete with transvaginal · 13 of 117 slices shown]
[im 1/117]
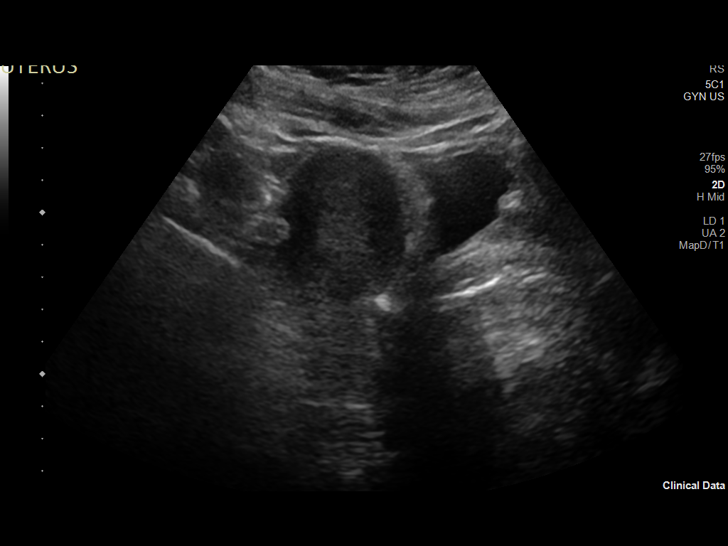
[im 10/117]
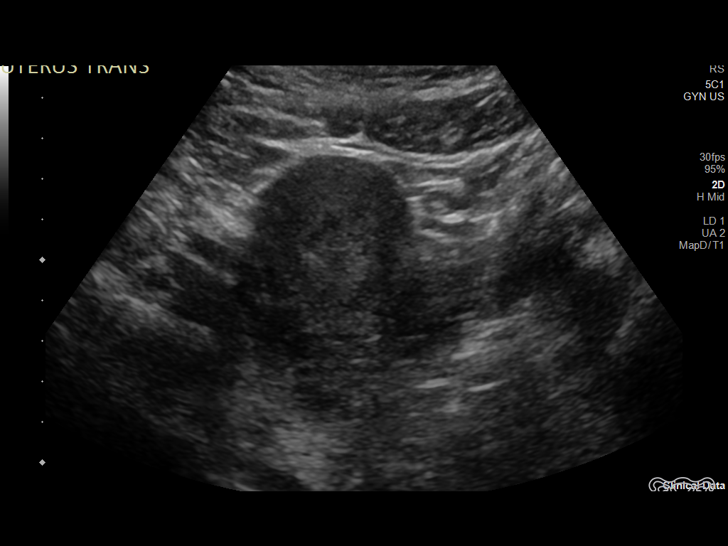
[im 20/117]
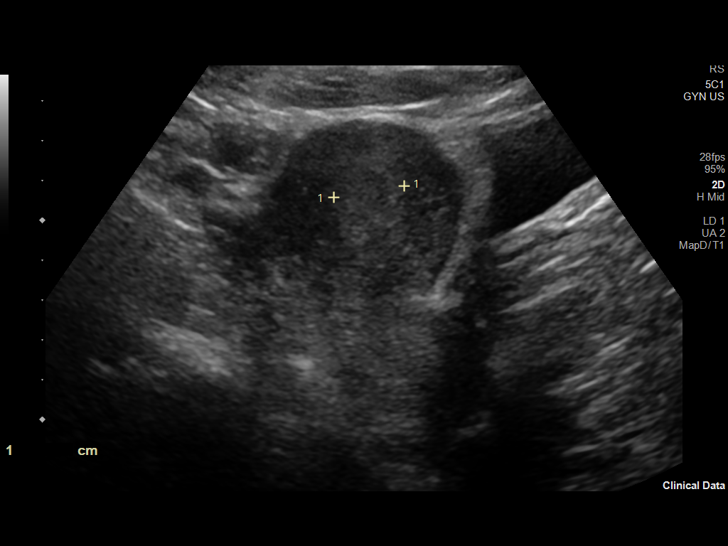
[im 30/117]
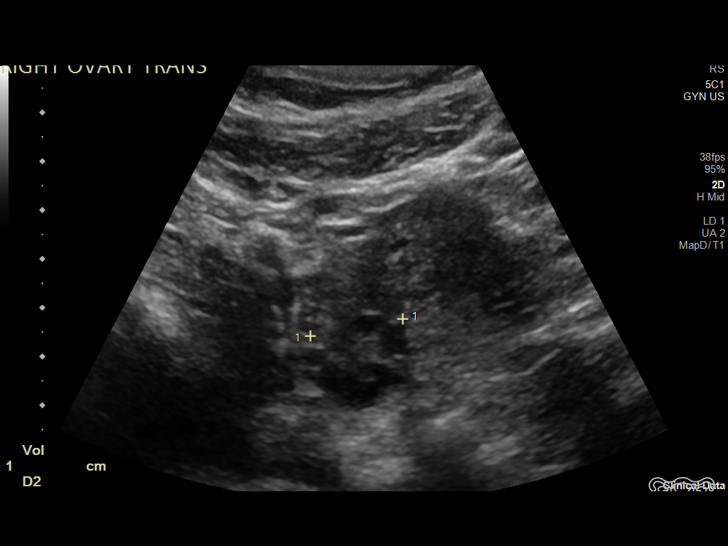
[im 39/117]
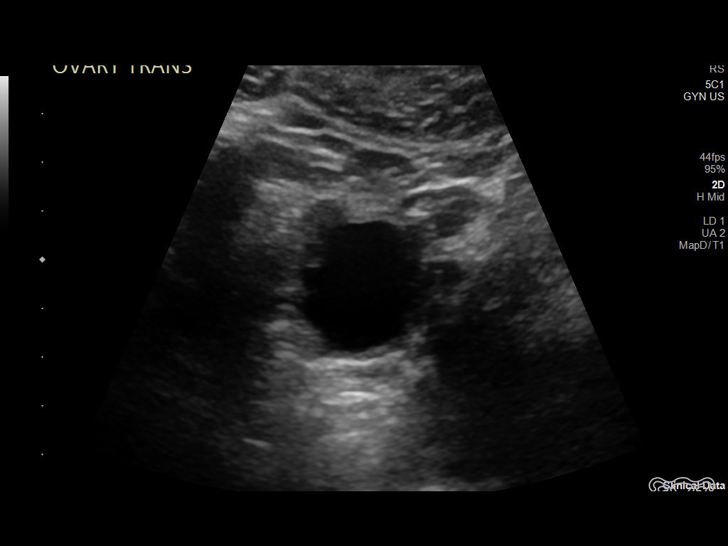
[im 49/117]
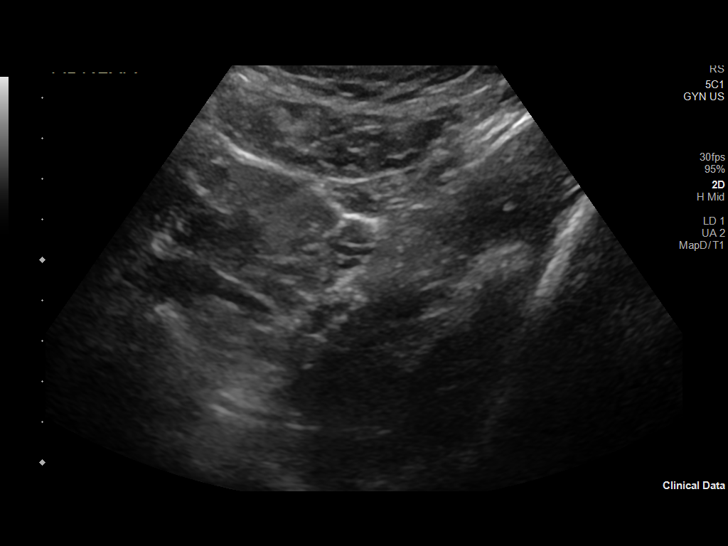
[im 59/117]
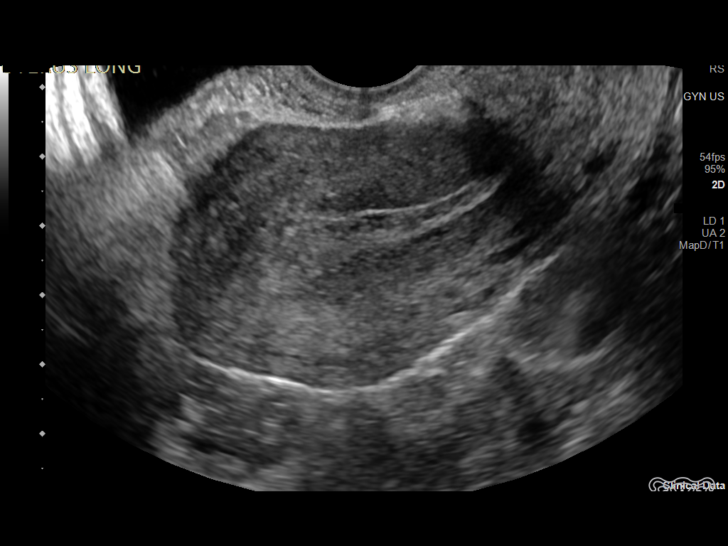
[im 68/117]
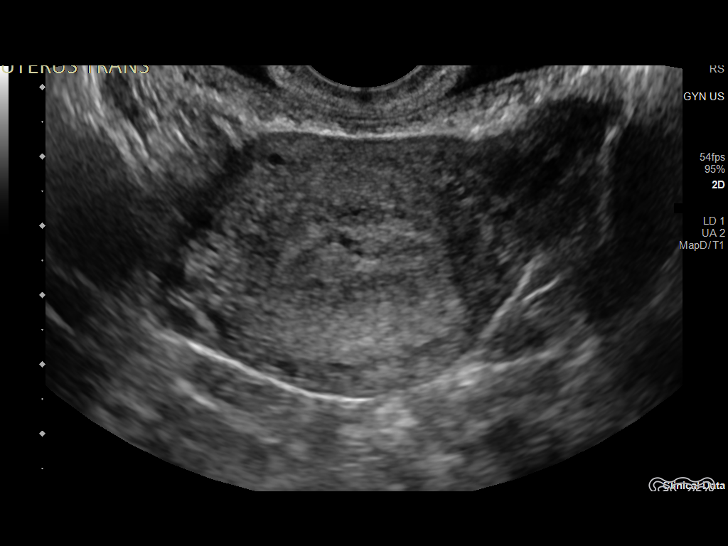
[im 78/117]
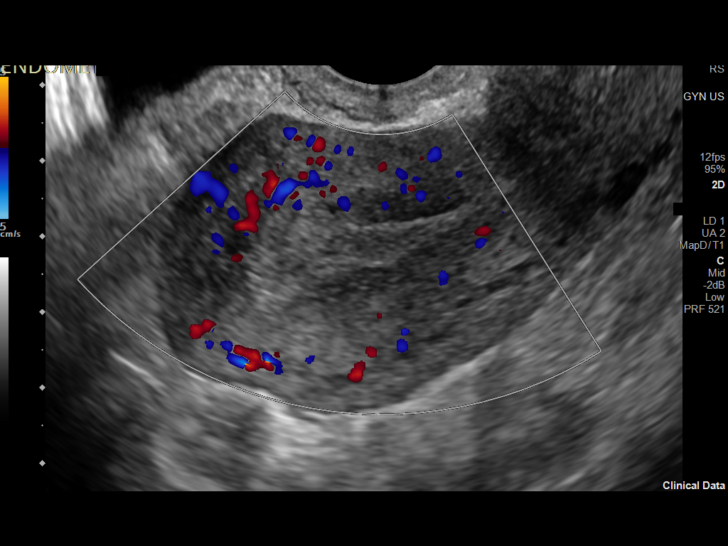
[im 88/117]
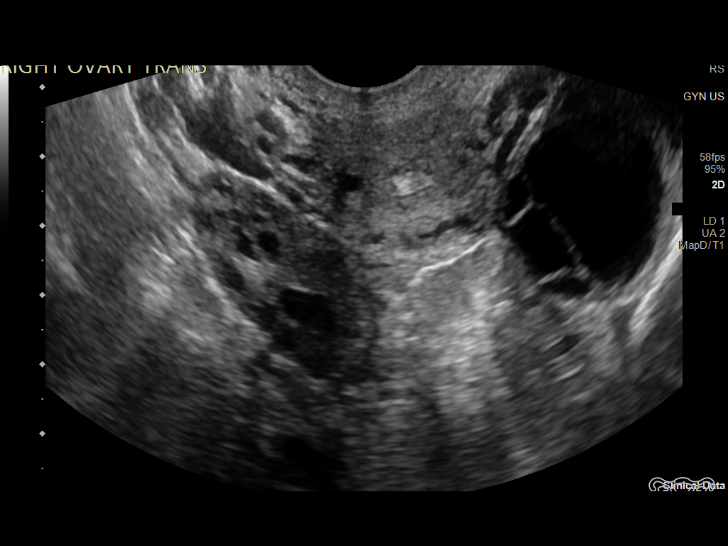
[im 97/117]
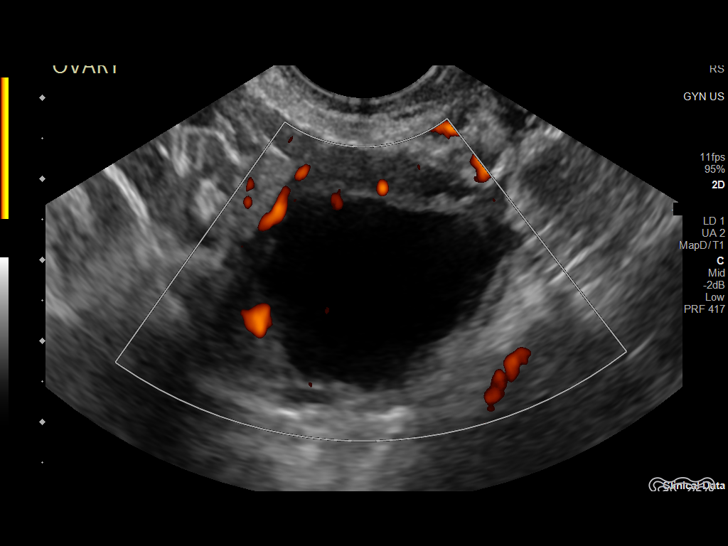
[im 107/117]
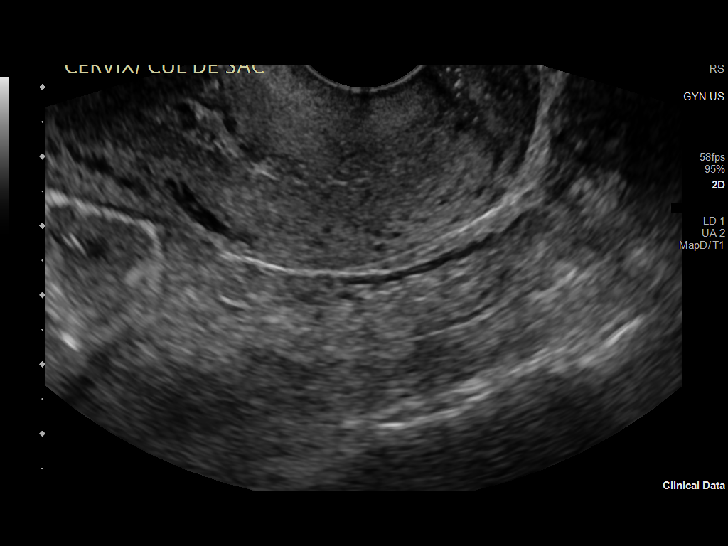
[im 117/117]
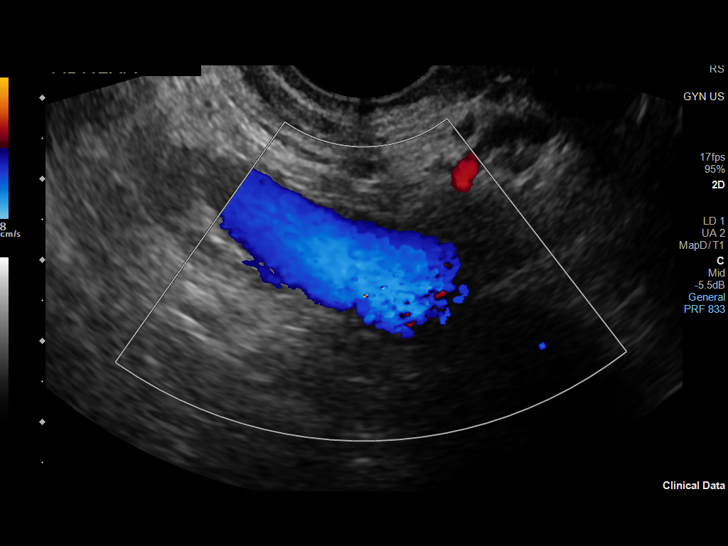

[13 of 25 positions shown; findings below may reference images not displayed]

FINDINGS: Uterus

Measurements: 8.2 x 4.1 x 4.9 cm = volume: 85 mL. No fibroids or
other mass visualized.

Endometrium

Thickness: 13 mm (image 75). Mildly heterogeneous endometrium
(images 73, 76), but no discrete endometrial lesion.

Right ovary

Measurements: 3.9 x 2.3 x 2.6 cm = volume: 12 mL. Within normal
limits, peripherally arranged small follicles (image 80).

Left ovary

Measurements: 4.3 x 3.3 x 4.0 cm = volume: 29 mL. Within normal
limits; anechoic 31 mm cyst with no internal vascularity (images 95
and 96) compatible with benign functional cyst. No follow up imaging
recommended. Reference: Radiology [DATE]):[id number].

Other findings

No pelvic free fluid.
IMPRESSION: 1. Mildly heterogeneous endometrium measuring up to 13 mm in
thickness. If bleeding remains unresponsive to hormonal or medical
therapy, sonohysterogram should be considered for focal lesion
work-up. (Ref: Radiological Reasoning: Algorithmic Workup of
Abnormal Vaginal Bleeding with Endovaginal Sonography and
Sonohysterography. AJR 8883; 191:S68-73).

2. Otherwise uterus and ovaries are within normal limits.

## 2023-04-11 ENCOUNTER — Other Ambulatory Visit (HOSPITAL_COMMUNITY)
Admission: RE | Admit: 2023-04-11 | Discharge: 2023-04-11 | Disposition: A | Discharge status: Home / Self Care | Payer: Commercial Managed Care - HMO | Source: Ambulatory Visit | Attending: Family Medicine | Admitting: Family Medicine

## 2023-04-11 ENCOUNTER — Ambulatory Visit (INDEPENDENT_AMBULATORY_CARE_PROVIDER_SITE_OTHER): Payer: Commercial Managed Care - HMO

## 2023-04-11 ENCOUNTER — Other Ambulatory Visit: Payer: Self-pay

## 2023-04-11 VITALS — BP 109/78 | HR 87 | Ht 67.0 in | Wt 239.7 lb

## 2023-04-11 DIAGNOSIS — Z113 Encounter for screening for infections with a predominantly sexual mode of transmission: Secondary | ICD-10-CM | POA: Insufficient documentation

## 2023-04-11 NOTE — Progress Notes (Signed)
 Pt here today for STD screening.  Pt reports that her partner tested positive for Trich and requests for testing.  Pt explained how to obtain self swab and that we will call with abnormal results.  Pt verbalized understanding with no further questions.   Lisa Byrd  04/11/23

## 2023-04-14 LAB — CERVICOVAGINAL ANCILLARY ONLY
Bacterial Vaginitis (gardnerella): POSITIVE — AB
Candida Glabrata: NEGATIVE
Candida Vaginitis: NEGATIVE
Chlamydia: NEGATIVE
Comment: NEGATIVE
Comment: NEGATIVE
Comment: NEGATIVE
Comment: NEGATIVE
Comment: NEGATIVE
Comment: NORMAL
Neisseria Gonorrhea: NEGATIVE
Trichomonas: NEGATIVE

## 2023-04-17 ENCOUNTER — Telehealth: Payer: Self-pay

## 2023-04-17 NOTE — Telephone Encounter (Addendum)
 Attempted to call patient. Left VM to call our office back.   Marcelino Duster, RN ----- Message from Warden Fillers sent at 04/15/2023  8:48 AM EST ----- BV noted on swab, offer treatment

## 2023-04-24 MED ORDER — METRONIDAZOLE 500 MG PO TABS: 500.0000 mg | ORAL_TABLET | Freq: Two times a day (BID) | ORAL | 0 refills | Status: DC

## 2023-04-24 NOTE — Telephone Encounter (Signed)
 Attempted to contact pt unable to leave message due to voicemail box not set up.  Flagyl e-prescribed.  MyChart message sent.   Leonette Nutting

## 2023-04-24 NOTE — Addendum Note (Signed)
 Addended by: Faythe Casa on: 04/24/2023 02:52 PM   Modules accepted: Orders

## 2023-05-08 ENCOUNTER — Ambulatory Visit: Payer: Commercial Managed Care - HMO | Admitting: Obstetrics and Gynecology

## 2023-06-10 ENCOUNTER — Encounter: Payer: Self-pay | Admitting: *Deleted

## 2023-06-10 ENCOUNTER — Ambulatory Visit (INDEPENDENT_AMBULATORY_CARE_PROVIDER_SITE_OTHER): Payer: Self-pay | Admitting: *Deleted

## 2023-06-10 DIAGNOSIS — Z3200 Encounter for pregnancy test, result unknown: Secondary | ICD-10-CM

## 2023-06-10 DIAGNOSIS — O3680X0 Pregnancy with inconclusive fetal viability, not applicable or unspecified: Secondary | ICD-10-CM

## 2023-06-10 DIAGNOSIS — Z3A00 Weeks of gestation of pregnancy not specified: Secondary | ICD-10-CM

## 2023-06-10 DIAGNOSIS — Z3201 Encounter for pregnancy test, result positive: Secondary | ICD-10-CM

## 2023-06-10 DIAGNOSIS — O219 Vomiting of pregnancy, unspecified: Secondary | ICD-10-CM

## 2023-06-10 DIAGNOSIS — Z3687 Encounter for antenatal screening for uncertain dates: Secondary | ICD-10-CM

## 2023-06-10 LAB — POCT PREGNANCY, URINE: Preg Test, Ur: POSITIVE — AB

## 2023-06-10 MED ORDER — PROMETHAZINE HCL 25 MG PO TABS
25.0000 mg | ORAL_TABLET | Freq: Four times a day (QID) | ORAL | 0 refills | Status: DC | PRN
Start: 2023-06-10 — End: 2023-11-21

## 2023-06-10 MED ORDER — PRENATAL 27-1 MG PO TABS
1.0000 | ORAL_TABLET | Freq: Every day | ORAL | 11 refills | Status: AC
Start: 2023-06-10 — End: ?

## 2023-06-10 NOTE — Progress Notes (Signed)
 Lisa Byrd dropped off a urine for a pregnancy test which was positive. I called Lisa Byrd to inform her of results and heard a message" the client you are calling is unavailable." Lisa Byrd 11:00 I called Lisa Byrd and informed her of results. She reports she is not sure of her LMP date but knows it was at least a month ago. Dating US  offered and accepted. Scheduled for 06/24/23. I reviewed meds with her. 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 Phenergan  rx offered and accepted. PNV also sent in but may use PNV gummies at first. She plans to go here for prenatal care. Lisa Byrd

## 2023-06-24 ENCOUNTER — Other Ambulatory Visit: Payer: Self-pay

## 2023-06-24 ENCOUNTER — Ambulatory Visit (INDEPENDENT_AMBULATORY_CARE_PROVIDER_SITE_OTHER): Payer: Self-pay

## 2023-06-24 ENCOUNTER — Encounter: Payer: Self-pay | Admitting: Family Medicine

## 2023-06-24 DIAGNOSIS — Z3687 Encounter for antenatal screening for uncertain dates: Secondary | ICD-10-CM

## 2023-06-24 DIAGNOSIS — O3680X0 Pregnancy with inconclusive fetal viability, not applicable or unspecified: Secondary | ICD-10-CM

## 2023-06-24 DIAGNOSIS — Z3A01 Less than 8 weeks gestation of pregnancy: Secondary | ICD-10-CM

## 2023-07-02 ENCOUNTER — Encounter: Payer: Self-pay | Admitting: Lactation Services

## 2023-07-02 ENCOUNTER — Telehealth: Payer: Self-pay | Admitting: Family Medicine

## 2023-07-02 ENCOUNTER — Encounter: Payer: Self-pay | Admitting: Family Medicine

## 2023-07-02 NOTE — Telephone Encounter (Signed)
 Patient has not heard back after U/S and would like a nurse to call back regarding her results of the U/S, she is thinking she is high risk due to the results she read via Mychart.

## 2023-07-04 ENCOUNTER — Telehealth: Payer: Self-pay | Admitting: Family Medicine

## 2023-07-04 DIAGNOSIS — Z3490 Encounter for supervision of normal pregnancy, unspecified, unspecified trimester: Secondary | ICD-10-CM

## 2023-07-04 DIAGNOSIS — O208 Other hemorrhage in early pregnancy: Secondary | ICD-10-CM

## 2023-07-04 NOTE — Telephone Encounter (Signed)
 Patient is calling to schedule her prenatal care she says that she was told after her last US  that a nurse would reach out to her to schedule another US . Does she need another US  or no? Her first one was on 5/12.

## 2023-07-04 NOTE — Telephone Encounter (Signed)
Pts concerns addressed in another encounter.

## 2023-07-04 NOTE — Addendum Note (Signed)
 Addended by: [place] on: 07/04/2023 05:08 PM   Modules accepted: Orders

## 2023-07-04 NOTE — Telephone Encounter (Signed)
 Contacted pt and discussed US  result from 5/13. Pt was advised that she needs follow up appt to re-evaluate cyst near ovary. She agreed to US  appt on 5/27 @ 9:55 am.

## 2023-07-09 ENCOUNTER — Other Ambulatory Visit: Payer: Self-pay

## 2023-07-09 ENCOUNTER — Ambulatory Visit: Payer: Self-pay

## 2023-07-09 DIAGNOSIS — O208 Other hemorrhage in early pregnancy: Secondary | ICD-10-CM

## 2023-07-09 DIAGNOSIS — Z3A09 9 weeks gestation of pregnancy: Secondary | ICD-10-CM

## 2023-07-09 DIAGNOSIS — Z3490 Encounter for supervision of normal pregnancy, unspecified, unspecified trimester: Secondary | ICD-10-CM

## 2023-07-25 ENCOUNTER — Telehealth: Payer: Self-pay

## 2023-07-25 DIAGNOSIS — O0991 Supervision of high risk pregnancy, unspecified, first trimester: Secondary | ICD-10-CM

## 2023-07-25 DIAGNOSIS — Z3A11 11 weeks gestation of pregnancy: Secondary | ICD-10-CM

## 2023-07-25 DIAGNOSIS — O0990 Supervision of high risk pregnancy, unspecified, unspecified trimester: Secondary | ICD-10-CM | POA: Insufficient documentation

## 2023-07-25 NOTE — Progress Notes (Signed)
 New OB Intake  I connected with Lisa Byrd  on 07/25/23 at  8:15 AM EDT by telephone Visit and verified that I am speaking with the correct person using two identifiers. Nurse is located at [facility] and pt is located at home.  I discussed the limitations, risks, security and privacy concerns of performing an evaluation and management service by telephone and the availability of in person appointments. I also discussed with the patient that there may be a patient responsible charge related to this service. The patient expressed understanding and agreed to proceed.  I explained I am completing New OB Intake today. We discussed EDD of 02/08/2024, by Ultrasound. Pt is [id number]. I reviewed her allergies, medications and Medical/Surgical/OB history.    There are no active problems to display for this patient.    Concerns addressed today  Delivery Plans Plans to deliver at [facility]. Discussed the nature of our practice with multiple providers including residents and students. Due to the size of the practice, the delivering provider may not be the same as those providing prenatal care.   Patient is interested in water birth.  MyChart/Babyscripts MyChart access verified. I explained pt will have some visits in office and some virtually. Babyscripts instructions given and order placed. Patient verifies receipt of registration text/e-mail. Account successfully created and app downloaded. If patient is a candidate for Optimized scheduling, add to sticky note.   Blood Pressure Cuff/Weight Scale Patient is self-pay; explained patient will be given BP cuff at first prenatal appt. Explained after first prenatal appt pt will check weekly and document in Babyscripts.   Anatomy US  Explained first scheduled US  will be around 19 weeks. Anatomy US  scheduled for 09/17/2023 at 8:00am.  Is patient a CenteringPregnancy candidate?  Declined Declined due to Schedule   Is patient a Mom+Baby Combined Care  candidate?  Not a candidate   If accepted, confirm patient does not intend to move from the area for at least 12 months, then notify Mom+Baby staff  Is patient a candidate for Babyscripts Optimization?   First visit review I reviewed new OB appt with patient. Explained pt will be seen by Susi Eric, FNP at first visit. Discussed [facility] [place] genetic screening with patient. Panor[employer] and Horizon.. Routine prenatal labs is needed at NOB visit.  Last Pap Diagnosis  Date Value Ref Range Status  06/14/2022   Final   - Negative for intraepithelial lesion or malignancy (NILM)    B'Aisha T Franco Duley, CMA 07/25/2023  9:01 AM

## 2023-07-25 NOTE — Patient Instructions (Signed)
 Safe Medications in Pregnancy   Acne:  Benzoyl Peroxide  Salicylic Acid   Backache/Headache:  Tylenol: 2 regular strength every 4 hours OR               2 Extra strength every 6 hours   Colds/Coughs/Allergies:  Benadryl (alcohol free) 25 mg every 6 hours as needed  Breath right strips  Claritin  Cepacol throat lozenges  Chloraseptic throat spray  Cold-Eeze- up to three times per day  Cough drops, alcohol free  Flonase (by prescription only)  Guaifenesin  Mucinex  Robitussin DM (plain only, alcohol free)  Saline nasal spray/drops  Sudafed (pseudoephedrine) & Actifed * use only after [redacted] weeks gestation and if you do not have high blood pressure  Tylenol  Vicks Vaporub  Zinc lozenges  Zyrtec   Constipation:  Colace  Ducolax suppositories  Fleet enema  Glycerin suppositories  Metamucil  Milk of magnesia  Miralax  Senokot  Smooth move tea   Diarrhea:  Kaopectate  Imodium A-D   *NO pepto Bismol   Hemorrhoids:  Anusol  Anusol HC  Preparation H  Tucks   Indigestion:  Tums  Maalox  Mylanta  Zantac  Pepcid   Insomnia:  Benadryl (alcohol free) 25mg  every 6 hours as needed  Tylenol PM  Unisom, no Gelcaps   Leg Cramps:  Tums  MagGel   Nausea/Vomiting:  Bonine  Dramamine  Emetrol  Ginger extract  Sea bands  Meclizine  Nausea medication to take during pregnancy:  Unisom (doxylamine succinate 25 mg tablets) Take one tablet daily at bedtime. If symptoms are not adequately controlled, the dose can be increased to a maximum recommended dose of two tablets daily (1/2 tablet in the morning, 1/2 tablet mid-afternoon and one at bedtime).  Vitamin B6 100mg  tablets. Take one tablet twice a day (up to 200 mg per day).   Skin Rashes:  Aveeno products  Benadryl cream or 25mg  every 6 hours as needed  Calamine Lotion  1% cortisone cream   Yeast infection:  Gyne-lotrimin 7  Monistat 7    **If taking multiple medications, please check labels to avoid  duplicating the same active ingredients  **take medication as directed on the label  ** Do not exceed 4000 mg of tylenol in 24 hours  **Do not take medications that contain aspirin or ibuprofen            [facility] Pediatric Providers  [place] ([address]) [facility] Manson Passey, MD; Deirdre Priest, MD; Lum Babe, MD; Leveda Anna, MD; McDiarmid, MD; Jerene Bears, MD [address] [place] [place], [place] [address] [phone] Mon-Fri 8:30-12:30, 1:30-5:00  Providers come to see babies during newborn hospitalization Only accepting infants of Mother's who are seen at [facility] or have siblings seen at   [facility] Medicaid - Yes; Tricare - Yes   [facility], MD [address] [place], [place] [address] [phone] Mon, Tue, Thur, Fri 8:30-5:00, Wed 10:00-7:00 (closed 1-2pm daily for lunch) [facility] residents with no insurance.  [place] [employer] only with Medicaid/insurance; Tricare - no  [facility]) - Tim and [facility], MD; Manson Passey, MD; Ave Filter, MD; Luna Fuse, MD; Kennedy Bucker, MD; Florestine Avers, MD; Melchor Amour, MD; Yetta Barre,  MD; Konrad Dolores, MD; Kathlene November, MD; Jenne Campus, MD; Wynetta Emery, MD; Duffy Rhody, MD; Gerre Couch, NP [address] [place], [place], [place] [address] [phone] Mon, Tue, Thur, Fri 8:30-5:30, Wed 9:30-5:30, Sat 8:30-12:30 Only accepting infants of first-time parents or siblings of current  patients Hospital discharge coordinator will make follow-up appointment Medicaid - yes; Tricare - yes  [place] ([address]) [facility], MD; Earlene Plater, MD; Jamesetta Orleans, MD; Alvera Novel, MD; Rana Snare, MD; [facility], MD; De Graff, MD; Hosie Poisson, MD; Mayford Knife, MD [address], [place], [place] [address] [phone] Mon-Fri 8:30-5:00, closed for lunch 12:30-1:30; Sat-Sun 10:00-1:00 Accepting Newborns with commercial insurance only, must call prior to  delivery to be accepted into  practice.  Medicaid - no, Tricare - yes   [facility] [address] [place], [place] [address] [phone] or [phone] Mon to Fri 8am to 10pm, Sat 8am to 1pm (virtual only on weekends) Only accepts Medicaid Healthy Blue pts  [facility] ([facility]) - Pediatrics at Elige Radon, MD; Sabino Dick, MD; Quitman Livings, MD; Betha Loa, NP; Claretha Cooper, MD; Lelon Perla, MD [address] [place] [place], [place] [address] [phone] Mon-Fri 8:30-5:30 Medicaid - yes, Tricare - yes  [place] [phone]) [facility] Marc[facility] Mowers, MD [address], [place], [place] [address] [phone] Iona Hansen, Wed Fri 8:30-5:00, Sat 8:30-12:00, Closed Thursdays Accepting siblings of established patients and first time mom's if you call prenatally Medicaid- yes; Tricare - yes  [facility] Lutricia Feil, [place]; Tracie Harrier, MD; Rusty Aus; Scifres, PA; Wynelle Link, MD; Azucena Cecil, MD;  [address] [place], [place] [address] [phone] Mon-Fri 8:30-5:00, closed for lunch 1-2 Only accepting newborns of established patients Medicaid- no; Tricare - yes  [facility] [phone]) [facility] Morene Crocker, MD; [address], [place], [place] [address] [phone] Mon-Fri 8:00-5:00 Medicaid - No; Tricare - Yes  [facility] at [facility], [place]; [place], [place] [address], [place], [place] [address] [phone] Mon-Fri 8:00-5:00 Medicaid - No, Tricare - Yes  [facility] Cardell Peach, MD; Nash Dimmer, MD; [place], [place] [address] [place], [place] [address] [phone]  Mon-Fri 8:00-5:00 Medicaid - No; Tricare - Yes  [facility] [address], [place], [place] [address] [phone] Mon-Fri 8:30-5:00 (lunch 12:00-1:00) Medicaid -Yes; Tricare - Yes  [facility], MD [address] [place],  [place], [place] [address] [phone] Mon-Fri 8:00-5:00 Seeing newborns of current patients only. No new patients Medicaid - No, Tricare - yes  Nature conservation officer at [place] [address] MD [address] [place], [place] [address] [phone] Mon-Fri 8:00-5:00 Medicaid -yes as secondary coverage only; Tricare - yes  [facility] [place], [place]; [place], [place]; Avis Epley, MD; Vonna Kotyk, MD; Clance Boll, MD; [place], [place]; [place], NP; Vaughan Basta, MD; [place], MD [address] [place], [place] [address] [phone] Mon-Fri 8:30-5:00, Sat 9:00-11:00 Accepts commercial insurance ONLY. Offers free prenatal information sessions for families. Medicaid - No, Tricare - Call first  [facility] [place], MD; [place], [place]; [place], [place]; [place], [place] [address] [place] [address] [phone] Mon-Fri 7:30-5:30 Medicaid - Yes; Ailene Rud yes  [place] [phone] & [phone])  [facility], MD [address] [place], [place] [address] [phone] Mon-Thur 8:00-6:00, closed for lunch 12-2, closed Fridays Medicaid - yes; Tricare - no  [facility] Dareen Piano, NP; Cyndia Bent, MD; [place], [place]; [place], [place] [address], [place], [place] [address] [phone] Mon-Fri 7:30-4:30 Medicaid - yes, Tricare - yes  [facility]  Juanito Doom, MD; Janene Harvey, NP; Vonita Moss, MD; Donn Pierini, NP [address], [place], [place] [address] [phone] Mon-Fri 8:30-5:00, closed for lunch 1-2, Sat 8:30-12:00 - sick visits only Providers come to see  babies at [facility] Only accepting newborns of siblings and first time parents ONLY if who have met with office prior to delivery Medicaid -Yes; Tricare - yes  [facility] - [place], [place]; Spero Geralds, NP; Earlene Plater, MD; Lucretia Roers, MD:  [address], [place], [place] [address] [phone] Mon- Fri 8:00-5:00, Sat 9:00-12:00 - sick  visits only Accepting siblings of established patients and first time mom/baby Medicaid - Yes; Tricare - yes Patients must have vaccinations (baby vaccines)  [place] [phone] & [phone])  Adult nurse HealthCare at [facility] [address] [place], [place] [address] [phone] Mon-Fri 8:00-5:00 Medicaid - no; Tricare - yes  [facility] [place], MD; [place], [place]; [place], [place] [address], [place], [place] [address] [phone] Mon-Fri 8:00-5:00 Medicaid- yes; Tricare - yes  [facility] - Ardeen Jourdain, MD; Yetta Barre, NP; [place], [place] [address] [place], [place], [place] [address] [phone] Mon-Fri 8:00-5:00 Medicaid - Yes; Tricare - yes  [address] [place] [phone])  [facility] [place], [place]; [place], [place]; Eddie Candle, MD; Normand Sloop, MD; [place], NP; [place], DO; [place], [place]; Constance Goltz, MD; Ruthann Cancer, MD; Vear Clock, MD; [place], [place]; [place], [place]; [place], [place] [address] [address] [facility] [address], [place], [place] [address] [phone] Mon-Fri 8:30-5:00, Sat 9:00-12:00 - sick only Please register online [email or web address] then schedule online or call office Medicaid-Yes; Tricare -yes  [facility] - [place]  [place], MD; Romualdo Bolk, MD; Kirby, MD; [place], NP; [place], [place]; Antonietta Barcelona, MD; Mayford Knife, NP; Shelva Majestic, MD [address], [place], [place] [address] [phone] Mon-Fri 8:00-5:30, Sat&Sun by appointment (phones open at 8:30) Medicaid - Yes; Tricare - yes  [place] [phone] & [phone]) [facility] Pediatrics Mariel Aloe; [place], MD; Roger Shelter, MD; Arvilla Market, NP; [place], DO [address], [place], [place] [address] [phone] M-F 8:00 - 5:15, Sat/Sun 9-12 sick visits only Medicaid - No; Tricare - yes  [facility]  [place], PA-C; [place], PA-C; [place], DO; [place], PA-C; [place], PA-C; Roselyn Bering, MD [address] [place], [place] [address] [phone] Mon-Thur 8:00-7:00, Fri 8:00-5:00 Accepting Medicaid for 13 and under only   [facility] - Family Medicine at [place] [facility] [place]) [place], [place]; List, FNP; Berneda Rose, MD; Luther Redo, PA-C; Lavonia Drafts, MD; Kellie Simmering, FNP; Genevie Cheshire, FNP; Evaristo Bury, MD; Berneda Rose, MD [phone] [address] [place], [place] [address] [phone] Mon-Fri 8:30-5:30 Medicaid - Yes; Tricare - yes  [facility] - [address]  [place], [place]; Whitney Post, MD; Hennie Duos, MD; Wynne Dust, MD; [place], NP [address], [place], [place] [address] [phone] Mon-Thur 8:00-5:30, Fri 8:00-5:00, Sat 9:00-12:00 Medicaid - yes, Tricare - yes  [place] [phone])  [facility], [place]; Lenise Arena, MD; [place], [place] [address], [place], [place] [address] [phone] Mon-Fri 8:00-5:00, closed for lunch 12-1 Medicaid - No; Tricare - yes  Nature conservation officer at [facility], MD [address] [place], [place] [address] [phone] Mon-Fri 8:00-5:00 Medicaid - No; Tricare - yes  [facility], MD; Tami Ribas, MD; Mariam Dollar, MD; Yetta Barre, MD [address], [place], [place] [address] [phone] Mon-Fri 8:00-5:00 Medicaid- Yes; Tricare - yes  [place] [phone])  Adult nurse HealthCare at [facility], [place]; [place], MD [address] [place], [place], [place] [address] [phone] Mon-Fri 8:00-5:00 Medicaid - No; Tricare - yes  [facility] - CPNP [address] [place], [place] [address] [phone] Mon-Weds 8:00-6:00, Thurs-Fri 8:00-5:00, Sat 9:00-12:00 Medicaid - yes; Tricare - yes   [facility] Katharina Caper, MD; [place], [place] [address] [place], [place] [address] [phone] Mon-Fri 8:00-5:00 Medicaid - yes; Tricare - yes  [employer]  Pediatric Providers  [facility] [address], [place], [place] [address] [phone] Sheral Flow: 8am -8pm, Tues, Weds: 8am - 5pm; Fri: 8-1 Medicaid - Yes; Tricare - yes  [facility] Rachel Bo, MD; Laural Benes, MD; Anner Crete, MD; [place], [place]; [place], [place] [address], [place], [place] [address] [phone] M-F 8:30 - 5:00 Medicaid - Call office; Tricare -yes  [facility] Edson Snowball, MD; Shanon Rosser, MD, Chelsea Primus, MD; Shirlyn Goltz, PNP; Wardell Heath, NP [phone] [address], [place], [place] [address] [phone] M-F 8:30 - 5:00, Sat/Sun 8:30 - 12:30 (sick visits) Medicaid - Call office; Tricare -yes  [facility] Melvyn Neth, MD; Karl Luke, PNP; Princess Bruins, MD; [place], [place]; [place], NP; Cynda Familia [address], [place], [place] [address] [phone] M-F 8:30 - 5:00 Medicaid - Call office; Tricare - yes  [facility] Jesusita Oka, MD; Dierdre Highman, MD; Earnest Conroy, MD; Timothy Lasso, MD; [place], MD [phone] [address] [place], [place] [address] [phone] M-Thur: 8:00 - 5:00; Fri: 8:00 - 4:00 Medicaid - yes; Tricare - yes  [facility] [address] [place], [place] [address] [phone] M-F: 8:30- 5:00, closed for lunch 12:30 - 1:00 Medicaid - yes; Tricare -yes  [facility] [address] [place], [place] [address] [phone] M-F 8:00 - 5:00 Medicaid - yes; Tricare - yes  [facility] [place], DO; [place], DO; [place], NP [address], [place], [place] [address] [phone] M-F 8:00 - 5:00, Closed 12-1 for lunch Medicaid - Call; Tricare - yes  [facility] Meredith Mody, MD [address], [place], [place] [address] [phone] M-F: 8:00-5:00, Sat: 8:00 - noon Medicaid - call; Tricare -yes  [facility] Pediatric Providers  [facility] [place], [place] [address] [place], [place], [place] [address] [phone] M-W: 8:00-5:00, Thur: 8:00 -  7:00, Fri: 8:00 - noon Medicaid - yes; Tricare - yes  [facility] - Quay Burow, FNP [address], [place], [place] [address] [phone] M-F 8:00 - 5:00, Closed for lunch 12-1 Medicaid - yes; Tricare - yes  [facility] Pediatric Providers  [facility] Primary Care at [place], [place], Alinda Money, MD, [place], FNP-C [address], [facility], [address], [place], [place] [address] [phone] M-T 8:00-5:00, Wed-Fri 7:00-6:00 Medicaid - Yes; Tricare -yes  [facility] at [facility], DO; [address] Saner [place], [place] [address] [phone] M-F 8:00 - 5:00, closed for lunch 12-1 Medicaid - Yes; Tricare - yes  [facility] Pediatrics and Internal Medicine  Zachery Dauer, MD; Gladstone Lighter, MD; Collie Siad, MD; Freda Jackson, MD; Rich Number, MD; Darryl Nestle, MD; Melinda Crutch, MD, Audria Nine, MD; Tawanna Cooler, MD; Steffanie Dunn, MD; Byrd Hesselbach, MD; Lucretia Roers, MD [address] [place], [place] [address] [phone] M-F 8:00-5:00 Medicaid - yes; Tricare - yes  [facility] [place], MD (speaks [place] and Hindi) [address] [place], [place] [address] [phone] M-F: 8:30 - 5:00, closed 12:30 - 1 for lunch Medicaid - Yes; Tricare -yes  [facility] Pediatric Providers  [facility] Shanda Bumps, MD; Chanetta Marshall, MD; Laurell Josephs,  MD [address] [place], [place] [address] [phone] M-Th: 8:00 - 5:30, Fri: 8:00 - 12:00 Medicaid - yes; Tricare - yes  [facility] Pediatrics at [facility], NP; Thora Lance, MD; Orrin Brigham, MD [phone] [address] [place], [place] [address] [phone] M-F: 8:00 - 5:00 Medicaid - yes; Tricare - yes  [facility] [place], NP; Orson Slick, NP; Salley Scarlet, NP; Linton Flemings, MD; Mayford Knife, MD, [place], NP, Emelda Fear, MD; Nida Boatman [address], [place], [place] [address] [phone] M-F: 8:30 - 5:30p Medicaid - yes; Tricare - yes Other locations available as well  [facility], MD; Andrey Campanile, MD; Neville Route, PA-C [address], [place], [place] [address] [phone] M-W: 8:00am - 7:00pm, Thurs: 8:00am - 8:00pm; Fri: 8:00am - 5:00pm, closed daily from 12-1 for lunch Medicaid - yes; Tricare - yes  [facility] Pediatric Providers  [facility] at Levin Erp, MD; Aggie Cosier, FNP; Bland Span, MD; Tristan Schroeder, MD; [place], PNP; Alesia Banda; [place], [place]; Julian Reil, MD;  [address], [place], [place] [address] [phone] Judie Petit - Caleen Essex: 8am - 5pm, Sat 9-noon Medicaid - Yes; Tricare -yes  [facility] at Jaclynn Guarneri, MD; Yetta Barre, FNP; Lilian Kapur, MD; Mariam Dollar, MD [address] [place], [place] [phone] M-F 8:00 - 5:00 Medicaid - call; Tricare - yes  [facility]- Cruz Condon, MD; [place], [place]; Delora Fuel, MD; Dareen Piano, MD; Trudee Grip, MD; Kizzie Ide, MD; Zebedee Iba; Birdena Crandall, MD; Hinton Dyer, MD; [place], MD [address], [place], [place] [address] [phone] M-F 8:00am - 5:00pm; Sat. 9:00 - 11:00 Medicaid - yes; Tricare - yes  [facility] at [facility], MD [address] [place], [place] [address] [phone] M-F 8:00 - 5:00 Medicaid - [place] Medicaid only; Tricare - yes  [facility] - Illene Bolus, MD; Earlene Plater, [place]; Kenyon Ana, MD [address], [place], [place] [address] [phone] M-F 8:00 - 5:00 Medicaid - yes; Tricare - yes  [facility] [address] Pediatrics - Lind Covert, MD; Manson Passey, MD, [facility], MD, [place], MD; [place], MD; Katrinka Blazing, MD; [address] [place], [place] [address] [phone] M-F: 8-5 Medicaid - yes; Tricare - yes  [facility] - Henrietta Hoover, [place]; [place], MD; [address] [place], [place] [address] [phone] M-F 8-5 Medicaid - yes; Tricare - yes  [address] [place] Darrol Poke, MD; Tami Ribas, MD; [place], MD; [place], DNP;  [place], PNP [address], [place], [place] [address] [phone] M-F 8-5 Medicaid - yes; Tricare - yes  [facility] Internal Medicine and Pediatrics Delories Heinz, MD; Adrienne Mocha; Ala Bent, MD [address] [place], [place] [address] [phone] M-F 7am - 5 pm Medicaid - call; Tricare - yes  [facility] [place], [place]; Fredia Beets, MD; Roxan Hockey, MD [address] [place], [place] [address] [phone] M-F 8-5 Medicaid - yes; Tricare - yes  [facility] Kae Heller, MD; Sheliah Hatch, MD; Mayford Knife, FNP; Shon Baton, FNP; Tyron Russell, FNP; Ishmael Holter; [facility] - FNP [address] [place], [place] [address] [phone] M-F 8-5 Medicaid- yes; Tricare - yes  [facility] - Betsy Coder, [place] and [place] Guest, MD; Terrial Rhodes, MD; Hulda Humphrey, MD; Roseanne [place], MD; [place], [place]; Ala Dach, MD; Fredia Beets, MD; Dimple Casey, MD [address], [place], [place] [address] [phone] M-F: 8-5, Sat: 9-4, Sun 9-12 Medicaid - yes; Tricare - yes  [facility] [place], PNP; [place], PNP [address] [place],  [place] [address] [phone] M-F 8 - 5, closed 12-1 for lunch Medicaid - yes; Tricare - yes  [facility] Kathyrn Lass, MD; Hal Neer, MD; Dimple Casey, MD; [place], DO [address] [place], [place] [address] [phone] M-F 8- 5:30 Medicaid - yes; Tricare - yes  [facility]s - Biagio Quint, MD; Rosalia Hammers, MD; Gwenith Daily, MD [address] [place], [place] [address] [phone] Judie Petit: Nicholas Lose; Tues-Fri: 8-5; Sat: 9-12 Medicaid - yes; Tricare - yes  [facility] - Bobbye Morton, MD; Daphane Shepherd, MD; Chestine Spore, MD; Haskell Riling, MD; Kate Sable, MD [address] [place], [place] [address] [phone] Judie PetitMarland Kitchen Nicholas LoseFrancee Nodal: 8-5; Sat: 8:30-12:30 Medicaid - yes;  Tricare - yes  [facility] Heck[facility]s - Beckey Rutter, MD; [place], [place] [address], [place], [place] [address] [phone] Mon-Fri: 8-5 Medicaid - yes; Tricare - yes  [facility] Pediatrics - [place], CPNP; [place], [place]; Dimple Casey, MD; Alisa Graff, MD; Cephus Shelling, MD; [address], [place], [place] [address] [phone] M-F: 8-5, closed 1-2 for lunch Medicaid - yes; Tricare - yes  [facility]s - [facility] [place], [place]; [place], [place]; Katrinka Blazing, MD; [place], CPNP; [place], [place]; [place], MD; Earlene Plater, MD [address], [place], [place] [address] [phone] M-Thurs: Nicholas Lose; Fri: 8-6; Sat: 9-12; Sun 2-4 Medicaid - yes; Tricare - yes  [facility] Georgeanna Lea, MD; Evette Cristal, MD; Shea Stakes, FNP; Earney Mallet, DO; [address] [place], [place] [address] [phone] M-F: 8-5 Medicaid - yes; Tricare - yes  [facility] Pediatric Providers  [facility] Family Medicine -Collene Mares, MD; [place], NP [address], [place], [place] [address] [phone] M - Fri: 8am - 5pm, closed for lunch 12-1 Medicaid - Yes; Tricare - yes  [facility] Elinor Parkinson, MD; Victory Dakin, MD; [place], DO; Vinocur, MD;Hall, PA; Clent [place], [place]; Orvan Falconer, NP [phone] [address] [place], [place] [address] [phone] M-F 8:00 - 5:00, Sat 8:00 - 11:30 Medicaid - yes; Tricare - yes  [facility] Welton Flakes, MD; [place], MD, [address], MD, [place], MD, [place], MD; [place], NP; [place], [place];  [address] [place], [place] [address] [phone] M-F 8:10am - 5:00pm Medicaid - yes; Tricare - yes  [facility] Eustaquio Boyden, MD; [place], NP [address] [place], [place] [address] [phone] M-F 8:00 - 5:00 Medicaid - [place] Medicaid only; Tricare - yes  [facility] - [facility] [address] [place], MD; [place], NP [address], [place], [place] [address] [phone] M-F 8:00 - 5:00; Closed for lunch 12 - 1:00 Medicaid - yes; Tricare - yes  [facility] Belva Crome, MD; Jonita Albee, FNP [address], [place], [place] [address] [phone] Mon 9-5; Tues/Wed 10-5; Thurs 8:30-5; Fri: 8-12:30 Medicaid - yes; Tricare - yes  [facility] Pediatric Providers  [facility]  [place], MD; [place], [place] [address] [place], [place] [address] [phone] phone [phone] fax M-F 7:15 - 4:30 Medicaid - yes; Tricare - yes  [place] -  Pediatrics Karilyn Cota, MD; [place], DO [address], [place], [place] [address] [phone] M-Fri: 8:30 - 5:00, closed for lunch everyday noon - 1pm Medicaid - Yes; Tricare - yes  [facility] Burd[place], MD; Reuel Boom, MD; Dimas Aguas, MD; Neita Carp, MD; [place], [place]; Bonnita Nasuti, [place]; [place], [place]; [place],  PA; [place], [place] [address] [place], [place] [address] [phone] M-Thurs: 7:30am - 7:00pm; Friday 7:30am - 4pm; Sat: 8:00 - 1:00 Medicaid - Yes; Tricare - yes  [place] [facility] Norval Morton, MD; Conni Elliot, MD; Carroll Kinds, MD; [place], DO [address], [place], [place] [address] [phone] M-Thur: 8:00 - 5:00, Fri: 8:00 - Noon Medicaid - yes; Tricare - yes No  [employer]  [place] [facility] [place], MD; Nadine Counts, DO; [place], NP; Daphine Deutscher, NP; Lequita Halt, NP; Ellamae Sia, NP; Reginia Forts, NP; Darlyn Read, MD; [place], [place] [address], [place], [place] [address] [phone] M-F 8:00 - 5:00 Medicaid - yes; Tricare - yes  [facility], FNP-C; [place], FNP-C [address] [place], [place] [address] [phone] M, W, R 8:00-5:00, Tues: 8:00am - 7:00pm; Fri 8:00 - noon Medicaid - Yes; Tricare - yes  [facility], MD [address] [place], [place] [address] [phone]  M-Thurs 8:30-5:30, Fri: 8:30-12:30pm Medicaid - Yes; Tricare - N    Considering Waterbirth? Guide for patients at Center for [facility]) Why consider waterbirth? Gentle birth for babies  Less pain medicine used in labor  May allow for passive descent/less pushing  May reduce perineal tears  More mobility and instinctive maternal position changes  Increased maternal relaxation   Is waterbirth safe? What are the risks of infection, drowning or other complications? Infection:  Very low risk (3.7 % for tub vs 4.8% for bed)  7 in 8000 waterbirths with documented infection  Poorly cleaned equipment most common cause  Slightly lower group B strep transmission rate  Drowning  Maternal:  Very low risk  Related to seizures or fainting  Newborn:  Very low risk. No evidence of increased risk of respiratory problems in multiple large studies  Physiological protection from breathing under water  Avoid underwater birth if there are any fetal complications  Once baby's head is out of the water, keep it out.  Birth complication  Some reports of cord trauma, but risk decreased by bringing baby to surface gradually  No evidence of increased risk of shoulder dystocia. Mothers can usually change positions faster in water than in a bed, possibly aiding the maneuvers to free the shoulder.   There are 2 things you MUST do to have a waterbirth with [facility]: Attend a waterbirth class at [facility] at [facility]   3rd Wednesday of every month from 7-9 pm (virtual during COVID) [employer] at [email or web address] or [email or web address] or by calling [phone] Bring [place] the certificate from the class to your prenatal appointment or send via MyChart Meet with a midwife at 36 weeks* to see if you can still plan a waterbirth and to sign the consent.   *We also recommend that you schedule as many of your prenatal  visits with a midwife as possible.    Helpful information: You may want to bring a bathing suit top to the hospital to wear during labor but this is optional.  All other supplies are provided by the hospital. Please arrive at the hospital with signs of active labor, and do not wait at home until late in labor. It takes 45 min- 1 hour for fetal monitoring, and check in to your room to take place, plus transport and filling of the waterbirth tub.    Things that would prevent you from having a waterbirth: Premature, <37wks  Previous cesarean birth  Presence of thick meconium-stained fluid  Multiple gestation (Twins, triplets, etc.)  Uncontrolled diabetes or gestational diabetes requiring medication  Hypertension diagnosed in pregnancy or preexisting hypertension (gestational hypertension, preeclampsia, or chronic hypertension) Fetal growth restriction (your baby measures less than 10th percentile on ultrasound) Heavy vaginal bleeding  Non-reassuring fetal heart rate  Active infection (MRSA, etc.). Group B Strep is NOT a contraindication for waterbirth.  If your labor has to be induced and induction method requires continuous monitoring of the baby's heart rate  Other risks/issues identified by your obstetrical provider   Please remember that birth is unpredictable. Under certain unforeseeable circumstances your provider may advise against giving birth in the tub. These decisions will be made on a case-by-case basis and with the safety of you and your baby as our highest priority.

## 2023-07-30 ENCOUNTER — Ambulatory Visit: Payer: Self-pay | Admitting: Obstetrics and Gynecology

## 2023-07-30 ENCOUNTER — Other Ambulatory Visit: Payer: Self-pay

## 2023-07-30 ENCOUNTER — Encounter: Payer: Self-pay | Admitting: Obstetrics and Gynecology

## 2023-07-30 VITALS — BP 122/83 | HR 105 | Wt 234.3 lb

## 2023-07-30 DIAGNOSIS — O0991 Supervision of high risk pregnancy, unspecified, first trimester: Secondary | ICD-10-CM

## 2023-07-30 DIAGNOSIS — Z3A12 12 weeks gestation of pregnancy: Secondary | ICD-10-CM | POA: Diagnosis not present

## 2023-07-30 DIAGNOSIS — Z98891 History of uterine scar from previous surgery: Secondary | ICD-10-CM | POA: Insufficient documentation

## 2023-07-30 DIAGNOSIS — O0990 Supervision of high risk pregnancy, unspecified, unspecified trimester: Secondary | ICD-10-CM

## 2023-07-30 DIAGNOSIS — Z1331 Encounter for screening for depression: Secondary | ICD-10-CM

## 2023-07-30 MED ORDER — ASPIRIN 81 MG PO TBEC: 81.0000 mg | DELAYED_RELEASE_TABLET | Freq: Every day | ORAL | 2 refills | Status: DC

## 2023-07-30 NOTE — Progress Notes (Signed)
 INITIAL PRENATAL VISIT  Subjective:   Lisa Byrd is being seen today for her first obstetrical visit.   She is at [redacted]w[redacted]d gestation by ultrasound Her obstetrical history is significant for history of cesarean delivery x2. Relationship with FOB: involved. Patient does intend to breast feed. Pregnancy history fully reviewed.  Patient reports no complaints.  Indications for ASA therapy (per uptodate)  Two or more of the following: Nulliparity No Obesity (body mass index >30 kg/m2) Yes Family history of preeclampsia in mother or sister No Age >=35 years No Sociodemographic characteristics (African American race, low socioeconomic level) Yes Personal risk factors (eg, previous pregnancy with low birth weight or small for gestational age infant, previous adverse pregnancy outcome [eg, stillbirth], interval >10 years between pregnancies) No    Objective:    Obstetric History OB History  Gravida Para Term Preterm AB Living  3 2 2   2   SAB IAB Ectopic Multiple Live Births      2    # Outcome Date GA Lbr Len/2nd Weight Sex Type Anes PTL Lv  3 Current           2 Term 07/15/12 [redacted]w[redacted]d   F CS-LTranv Spinal  LIV  1 Term 10/26/10 [redacted]w[redacted]d   M CS-LTranv EPI  LIV    Past Medical History:  Diagnosis Date   Abnormal Pap smear    Anemia    Broken arm    as child   Late prenatal care 06/16/2012            Clinic     [facility]      Genetic Screen     too late      Anatomic US      WNL      Glucose Screen     95      TDaP vaccine     ordered      Flu vaccine           CF Screen           GBS     neg      Baby Food           Contraception           Circumcision                Medical history non-contributory    Previous cesarean delivery affecting pregnancy, antepartum 06/16/2012   States they rushed it, IOL with progress to 4cm then they said I needed a section.  May want repeat C/S but would TOLAC if labors.           Genetic Screen     Too Late      Anatomic US      Normal at 24 wks       Glucose Screen           GBS           Feeding Preference           Contraception           Circumcision                 Past Surgical History:  Procedure Laterality Date   CESAREAN SECTION  10/26/2010   Procedure: CESAREAN SECTION;  Surgeon: Ashby Lawman;  Location: [facility];  Service: Gynecology;  Laterality: N/A;   CESAREAN SECTION N/A 07/15/2012   Procedure: Repeat CESAREAN SECTION  scar revision;  Surgeon: Lenord Radon, MD;  Location: [facility];  Service: Obstetrics;  Laterality: N/A;    Current Outpatient Medications on File Prior to Visit  Medication Sig Dispense Refill   Prenatal 27-1 MG TABS Take 1 tablet by mouth daily. 30 tablet 11   promethazine  (PHENERGAN ) 25 MG tablet Take 1 tablet (25 mg total) by mouth every 6 (six) hours as needed for nausea or vomiting. 30 tablet 0   No current facility-administered medications on file prior to visit.    No Known Allergies  Social History:  reports that she has been smoking cigarettes. She has never used smokeless tobacco. She reports current drug use. Drug: Marijuana. She reports that she does not drink alcohol.  Family History  Problem Relation Age of Onset   Hypertension Father    Diabetes Maternal Uncle    Healthy Mother     The following portions of the patient's history were reviewed and updated as appropriate: allergies, current medications, past family history, past medical history, past social history, past surgical history and problem list.  Review of Systems Review of Systems  All other systems reviewed and are negative.   Physical Exam:  BP 122/83   Pulse (!) 105   Wt 234 lb 4.8 oz (106.3 kg)   BMI 36.70 kg/m  CONSTITUTIONAL: Well-developed, well-nourished female in no acute distress.  HENT:  Normocephalic, atraumatic.  NECK: Normal range of motion SKIN: Skin is warm and dry. MUSCULOSKELETAL: Normal range of motion NEUROLOGIC: Alert and oriented  PSYCHIATRIC: Normal mood and affect. Normal behavior.   RESPIRATORY: normal effort ABDOMEN: Soft PELVIC:deferred      Movement: Absent       Assessment:    Pregnancy: G3P2002  1. Supervision of high risk pregnancy, antepartum (Primary) FHR and movement detected by bedside and shown to patient Discussed recommendation for ASA during pregnancy, rx sent  2. History of cesarean section First c/s for arrest dilation  Second c/s repeat Interested in tolac, will discuss   3. [redacted] weeks gestation of pregnancy  - Culture, OB Urine - GC/Chlamydia probe amp ([place])not at [facility] - CBC/D/Plt+RPR+Rh+ABO+RubIgG... - Hemoglobin A1c - PANORAMA PRENATAL TEST - [place] Basic Panel - aspirin EC 81 MG tablet; Take 1 tablet (81 mg total) by mouth daily. Start taking when you are [redacted] weeks pregnant for rest of pregnancy for prevention of preeclampsia  Dispense: 300 tablet; Refill: 2  4. Positive screening for depression on 9-item Patient Health Questionnaire (PHQ-9)  - Ambulatory referral to Integrated Behavioral Health       Plan:     Initial labs drawn. Prenatal vitamins. Problem list reviewed and updated. Reviewed in detail the nature of the practice with collaborative care between  Genetic screening discussed: NIPS/First trimester screen/Quad/AFP ordered. Role of ultrasound in pregnancy discussed; Anatomy US : ordered.  Discussed clinic routines, schedule of care and testing, genetic screening options, involvement of students and residents under the direct supervision of APPs and doctors and presence of female providers. Pt verbalized understanding.   Future Appointments  Date Time Provider Department Center  08/29/2023  1:15 PM Abigail Abler, MD [facility]  09/17/2023  8:00 AM [facility] PROVIDER 1 [facility]  09/17/2023  8:30 AM [facility] US5 [facility]     Zelma Hidden, FNP

## 2023-07-30 NOTE — Patient Instructions (Signed)
 We highly recommend childbirth education to help you plan for labor and begin practicing coping skills (which will be needed with or without pain meds).  [place] Childbirth Education Options: Sign up by visiting [email or web address]  Childbirth ~ Self-Paced eClass (English and Spanish) This online class offers you the freedom to complete a childbirth education series in the comfort of your own home at your own pace.  Childbirth Class (In-Person 4-Week Series  or on Saturdays, Virtual 4-Week Series ~ [place]) This interactive in-person class series will help you and your partner prepare for your birth experience. Topics include: Labor & Birth, Comfort Measures, Breathing Techniques, Massage, Medical Interventions, Pain Management Options, Cesarean Birth, Postpartum Care, and Newborn Care  Comfort Techniques for Labor ~ In-Person Class [facility]) This interactive class is designed for parents-to-be who want to learn & practice hands-on skills to help relieve some of the discomfort of labor and encourage their babies to rotate toward the best position for birth. Moms and their partners will be able to try a variety of labor positions with birth balls and rebozos as well as practice breathing, relaxation, and visualization techniques.  Natural Childbirth Class (In-Person 5-Week Series, In-Person on Saturdays or Virtual 5-Week Series ~ [place]) This class series is designed for expectant parents who want to learn and practice natural methods of coping with the process of labor and childbirth.  Cesarean Birth Self-Paced eClass (English and Spanish) This online course provides comprehensive information you can trust as you prepare for a possible cesarean birth. In this class, you'll learn how to make your birth and recovery comfortable and joyful through instructive video clips, animations, and activities.  Waterbirth ~ Airline pilot Interested in a waterbirth? In addition to a consultation  with your credentialed waterbirth provider, this free, informational online class will help you discover whether waterbirth is the right fit for you. Not all obstetrical practices offer waterbirth, so check with your healthcare provider.  Tour Probation officer) - [facility] [employer] our 4 minute video tour of [facility] located in [place].   [place] Parenting Education Options:  Pregnancy 101 (Virtual) Congratulations on your pregnancy! This class is geared toward moms in their first trimester, but everyone is welcome. We are excited to guide you through all aspects of supporting a healthy pregnancy. You will learn what to expect at routine prenatal care appointments, common postpartum adjustments, basic infant safety, and breastfeeding.  Successful Partnering & Parenting ~ In-Person Workshop [facility]) This workshop inspires and equips partners of all economic levels, ages, and cultures to confidently care for their infants, support the birthing persons, and navigate their own transformations into new partners and parents. Learning activities are geared towards supporting partner, but moms are welcome to attend.  'Baby & Me' Parenting Group (Virtual on Wednesdays at 11am) Enjoy this time discussing newborn & infant parenting topics and family adjustment issues with other new parents in a relaxed environment. Each week brings a new speaker or baby-centered activity. This group offers support and connection to parents as they journey through the adjustments and struggles of that sometimes overwhelming first year after the birth of a child.  Baby Safety, CPR, & Choking Class ~ Virtual This life-saving information is meant to encourage parents as they learn important safety and prevention tips as well as infant CPR and relief of choking.  Breastfeeding Class (In-Person in [place] or [employer]) Families learn what to expect in the  first days and weeks of breastfeeding your  newborn. IF YOU ARE AN EMPLOYEE TAKING THIS CLASS FOR CREDIT, DO NOT register yourself. Please e-mail [email or web address].   Breastfeeding Self-Paced eClass (English & Spanish) Families learn what to expect in the first days and weeks of breastfeeding your newborn.  Caring for Baby ~ In-Person, Virtual or Self-Paced Class This in-person class is for both expectant and adoptive parents who want to learn and practice the most up-to-date newborn care for their babies. Focus is on birth through the first six weeks of life.  CPR & Choking Relief for Infants & Children ~ In-Person Class [facility]) This in-person course is designed for any parent, expectant parent, or adult who cares for infants or children. Participants learn and demonstrate cardiopulmonary resuscitation and choking relief procedures for both infants and children.  Grandparent Love ~ In-Person Class Grandparents will learn the most updated infant care and safety recommendations. They will discover ways to support their own children during the transition into the parenting role and receive tips on communicating with the new parents.  [place] Parenting Support Group Options:  Bereavement Grief Support Group (Pregnancy/Infant Loss) - Virtual This is an ongoing experience that meets once a month and is designed to help you honor the past, assist you in discovering tools to strengthen you today, and aid you in developing hope for the future.  Breastfeeding & Pumping Support Group (In-Person on Thursdays at 12pm or Virtual on Tuesdays at 5pm) Join [place] in-person each Thursday starting June 1st, 2023 at 12pm! This support group is free for all families looking for breastfeeding and/or pumping support.   Community-Based Childbirth Education Options:  [facility] Department Classes:  Childbirth education classes can help you get ready for a positive parenting experience. You  can also meet other expectant parents and get free stuff for your baby. Each class runs for five weeks on the same night and costs $45 for the mother-to-be and her support person. Medicaid covers the cost if you are eligible. Call [phone] to register.  [facility] offers a variety of programs for the [employer] and is another great way to get connected. Please go to [email or web address] for more information.  Childbirth With A Twist! Be informed of your options, get educated on birth, understand what your body is doing, learn how to cope, and have a lot of fun and laughs all while doing it either from the comfort of your couch OR in our cozy office and classroom space near the [place] airport. If you are taking a virtual class, then class is taught LIVE, so you can ask questions and receive answers in real-time from an experienced doula and childbirth educator.  This virtual childbirth education class will meet for five instruction times online.  Although we are based in [place], [place], this virtual class is open to anyone in the world. Please visit: [email or web address] for more information.  Books We Love: The Doula Guide to Childbirth by Harland German and Otila Back The First-Time Parent's Childbirth Handbook by Dr. Amie Critchley, CNM The Birth Partner by Truddie Crumble

## 2023-07-31 ENCOUNTER — Ambulatory Visit: Payer: Self-pay | Admitting: Obstetrics and Gynecology

## 2023-07-31 ENCOUNTER — Other Ambulatory Visit: Payer: Self-pay

## 2023-07-31 DIAGNOSIS — O0990 Supervision of high risk pregnancy, unspecified, unspecified trimester: Secondary | ICD-10-CM

## 2023-07-31 LAB — CBC/D/PLT+RPR+RH+ABO+RUBIGG...
Antibody Screen: NEGATIVE
Basophils Absolute: 0.1 10*3/uL (ref 0.0–0.2)
Basos: 1 %
EOS (ABSOLUTE): 0.1 10*3/uL (ref 0.0–0.4)
Eos: 2 %
HCV Ab: NONREACTIVE
HIV Screen 4th Generation wRfx: NONREACTIVE
Hematocrit: 39 % (ref 34.0–46.6)
Hemoglobin: 12.6 g/dL (ref 11.1–15.9)
Hepatitis B Surface Ag: NEGATIVE
Immature Grans (Abs): 0 10*3/uL (ref 0.0–0.1)
Immature Granulocytes: 0 %
Lymphocytes Absolute: 2.5 10*3/uL (ref 0.7–3.1)
Lymphs: 30 %
MCH: 29 pg (ref 26.6–33.0)
MCHC: 32.3 g/dL (ref 31.5–35.7)
MCV: 90 fL (ref 79–97)
Monocytes Absolute: 0.4 10*3/uL (ref 0.1–0.9)
Monocytes: 4 %
Neutrophils Absolute: 5.3 10*3/uL (ref 1.4–7.0)
Neutrophils: 63 %
Platelets: 316 10*3/uL (ref 150–450)
RBC: 4.35 x10E6/uL (ref 3.77–5.28)
RDW: 16 % — ABNORMAL HIGH (ref 11.7–15.4)
RPR Ser Ql: NONREACTIVE
Rh Factor: POSITIVE
Rubella Antibodies, IGG: 3.06 {index} (ref >0.99)
WBC: 8.3 10*3/uL (ref 3.4–10.8)

## 2023-07-31 LAB — HEMOGLOBIN A1C
Est. average glucose Bld gHb Est-mCnc: 111 mg/dL
Hgb A1c MFr Bld: 5.5 % (ref 4.8–5.6)

## 2023-07-31 LAB — HCV INTERPRETATION

## 2023-07-31 MED ORDER — BLOOD PRESSURE KIT DEVI: 1.0000 | Freq: Once | 0 refills | Status: AC

## 2023-08-01 DIAGNOSIS — Z3480 Encounter for supervision of other normal pregnancy, unspecified trimester: Secondary | ICD-10-CM | POA: Diagnosis not present

## 2023-08-07 LAB — PANORAMA PRENATAL TEST FULL PANEL:PANORAMA TEST PLUS 5 ADDITIONAL MICRODELETIONS: FETAL FRACTION: 8.2

## 2023-08-08 ENCOUNTER — Inpatient Hospital Stay (HOSPITAL_COMMUNITY)
Admission: AD | Admit: 2023-08-08 | Discharge: 2023-08-08 | Disposition: A | Discharge status: Home / Self Care | Attending: Family Medicine | Admitting: Family Medicine

## 2023-08-08 ENCOUNTER — Encounter (HOSPITAL_COMMUNITY): Payer: Self-pay | Admitting: Family Medicine

## 2023-08-08 DIAGNOSIS — A5901 Trichomonal vulvovaginitis: Secondary | ICD-10-CM | POA: Insufficient documentation

## 2023-08-08 DIAGNOSIS — O26891 Other specified pregnancy related conditions, first trimester: Secondary | ICD-10-CM | POA: Insufficient documentation

## 2023-08-08 DIAGNOSIS — O23591 Infection of other part of genital tract in pregnancy, first trimester: Secondary | ICD-10-CM | POA: Diagnosis not present

## 2023-08-08 DIAGNOSIS — O219 Vomiting of pregnancy, unspecified: Secondary | ICD-10-CM | POA: Diagnosis not present

## 2023-08-08 DIAGNOSIS — O98311 Other infections with a predominantly sexual mode of transmission complicating pregnancy, first trimester: Secondary | ICD-10-CM | POA: Insufficient documentation

## 2023-08-08 DIAGNOSIS — R531 Weakness: Secondary | ICD-10-CM | POA: Insufficient documentation

## 2023-08-08 DIAGNOSIS — R0602 Shortness of breath: Secondary | ICD-10-CM | POA: Diagnosis not present

## 2023-08-08 DIAGNOSIS — Z3A13 13 weeks gestation of pregnancy: Secondary | ICD-10-CM | POA: Diagnosis not present

## 2023-08-08 DIAGNOSIS — O26899 Other specified pregnancy related conditions, unspecified trimester: Secondary | ICD-10-CM

## 2023-08-08 DIAGNOSIS — O0990 Supervision of high risk pregnancy, unspecified, unspecified trimester: Secondary | ICD-10-CM

## 2023-08-08 HISTORY — DX: Benign neoplasm of connective and other soft tissue, unspecified: D21.9

## 2023-08-08 LAB — URINALYSIS, ROUTINE W REFLEX MICROSCOPIC
Bilirubin Urine: NEGATIVE
Glucose, UA: NEGATIVE mg/dL
Hgb urine dipstick: NEGATIVE
Ketones, ur: NEGATIVE mg/dL
Nitrite: NEGATIVE
Protein, ur: 30 mg/dL — AB
Specific Gravity, Urine: 1.021 (ref 1.005–1.030)
pH: 7 (ref 5.0–8.0)

## 2023-08-08 LAB — WET PREP, GENITAL
Clue Cells Wet Prep HPF POC: NONE SEEN
Sperm: NONE SEEN
WBC, Wet Prep HPF POC: >=10 — AB (ref <10)
Yeast Wet Prep HPF POC: NONE SEEN

## 2023-08-08 LAB — GC/CHLAMYDIA PROBE AMP (~~LOC~~) NOT AT ARMC
Chlamydia: NEGATIVE
Neisseria Gonorrhea: NEGATIVE

## 2023-08-08 MED ORDER — PROCHLORPERAZINE MALEATE 10 MG PO TABS: 10.0000 mg | ORAL_TABLET | Freq: Four times a day (QID) | ORAL | 0 refills | Status: DC | PRN

## 2023-08-08 MED ORDER — METRONIDAZOLE 500 MG PO TABS: 500.0000 mg | ORAL_TABLET | Freq: Two times a day (BID) | ORAL | 0 refills | Status: DC

## 2023-08-08 MED ORDER — ONDANSETRON 4 MG PO TBDP
8.0000 mg | ORAL_TABLET | Freq: Once | ORAL | Status: AC
  Administered 2023-08-08: 8 mg via ORAL
  Filled 2023-08-08: qty 2

## 2023-08-08 NOTE — MAU Note (Signed)
 Lisa Byrd is a 33 y.o. at [redacted]w[redacted]d here in MAU reporting: been cramping lately, been unbearable.  Last night was really bad, that is what made her call in .  Feels weak and gets SOB.  Feels more than 3 months.  Just want to make sure everything is ok.  Got her genetic testing, she didn't understand what it meant.. Her mouth has been so dry.  She has been drinking, but still dry.  (Has not peed yet today-instructed pt - she is NOT drinking enough) No bleeding. Has a D/c with odor. Has been constipated.denies urinary symptoms Onset of complaint: about a wk, on and off Pain score: 6 Vitals:   08/08/23 1313  BP: 132/87  Pulse: (!) 101  Resp: 17  Temp: 99.3 F (37.4 C)  SpO2: 100%     FHT:150 Lab orders placed from triage:  urine and vag swabs

## 2023-08-08 NOTE — MAU Provider Note (Signed)
 History     CSN: [id number]  Arrival date and time: 08/08/23 1223   Event Date/Time   First Provider Initiated Contact with Patient 08/08/23 1637      Chief Complaint  Patient presents with   Abdominal Pain   HPI [name] is a 33 y.o. year old G35P2002 female at [redacted]w[redacted]d weeks gestation who presents to MAU reporting lower abdominal cramping. She describes it as unbearable. She reports the cramping was really bad last night; so bad she called in. She reports of lots of vaginal d/c. She reports she feels weak and SOB. She wants to make sure everything was okay.She receives [facility] with [facility]; next appt is 08/29/2023.   OB History     Gravida  3   Para  2   Term  2   Preterm      AB      Living  2      SAB      IAB      Ectopic      Multiple      Live Births  2           Past Medical History:  Diagnosis Date   Abnormal Pap smear    Anemia    Broken arm    as child   Fibroid    Late prenatal care 06/16/2012            Clinic     [facility]      Genetic Screen     too late      Anatomic US      WNL      Glucose Screen     95      TDaP vaccine     ordered      Flu vaccine           CF Screen           GBS     neg      Baby Food           Contraception           Circumcision                Medical history non-contributory    Previous cesarean delivery affecting pregnancy, antepartum 06/16/2012   States they rushed it, IOL with progress to 4cm then they said I needed a section.  May want repeat C/S but would TOLAC if labors.           Genetic Screen     Too Late      Anatomic US      Normal at 24 wks      Glucose Screen           GBS           Feeding Preference           Contraception           Circumcision                 Past Surgical History:  Procedure Laterality Date   CESAREAN SECTION  10/26/2010   Procedure: CESAREAN SECTION;  Surgeon: Truman Corona;  Location: [facility];  Service: Gynecology;  Laterality: N/A;   CESAREAN SECTION N/A 07/15/2012    Procedure: Repeat CESAREAN SECTION  scar revision;  Surgeon: Elveria Mungo, MD;  Location: [facility];  Service: Obstetrics;  Laterality: N/A;    Family History  Problem Relation Age of Onset   Healthy Mother  Stroke Father    Hypertension Father    Diabetes Maternal Uncle     Social History   Tobacco Use   Smoking status: Former    Types: Cigarettes   Smokeless tobacco: Never   Tobacco comments:    Pt stated trying to stop. Smokes 3 cigarettes a day.  Vaping Use   Vaping status: Never Used  Substance Use Topics   Alcohol use: No    Comment: Pt denies    Drug use: Not Currently    Types: Marijuana    Comment: THC -stopped when + preg test    Allergies: No Known Allergies  Medications Prior to Admission  Medication Sig Dispense Refill Last Dose/Taking   Prenatal 27-1 MG TABS Take 1 tablet by mouth daily. 30 tablet 11 08/08/2023 Morning   aspirin  EC 81 MG tablet Take 1 tablet (81 mg total) by mouth daily. Start taking when you are [redacted] weeks pregnant for rest of pregnancy for prevention of preeclampsia 300 tablet 2    promethazine  (PHENERGAN ) 25 MG tablet Take 1 tablet (25 mg total) by mouth every 6 (six) hours as needed for nausea or vomiting. 30 tablet 0 More than a month    Review of Systems  Constitutional: Negative.   HENT: Negative.    Eyes: Negative.   Respiratory: Negative.    Cardiovascular: Negative.   Gastrointestinal: Negative.   Endocrine: Negative.   Genitourinary:  Positive for pelvic pain and vaginal discharge (lots).  Musculoskeletal: Negative.   Skin: Negative.   Allergic/Immunologic: Negative.   Neurological:  Positive for weakness.  Hematological: Negative.   Psychiatric/Behavioral: Negative.     Physical Exam   Blood pressure 132/87, pulse (!) 101, temperature 99.3 F (37.4 C), temperature source Oral, resp. rate 17, height 5' 7 (1.702 m), weight 106.7 kg, SpO2 100%.  Physical Exam Vitals and nursing note reviewed.   Constitutional:      Appearance: Normal appearance. She is obese.   Cardiovascular:     Rate and Rhythm: Tachycardia present.  Pulmonary:     Effort: Pulmonary effort is normal.  Genitourinary:    Comments: Swabs collected by patient using blind swab technique   Musculoskeletal:        General: Normal range of motion.   Skin:    General: Skin is warm and dry.   Neurological:     Mental Status: She is alert and oriented to person, place, and time.   Psychiatric:        Mood and Affect: Mood normal.        Behavior: Behavior normal.        Thought Content: Thought content normal.        Judgment: Judgment normal.     MAU Course  Procedures  MDM CCUA Wet Prep GC/CT -- Results pending   Results for orders placed or performed during the hospital encounter of 08/08/23 (from the past 24 hours)  Urinalysis, Routine w reflex microscopic -Urine, Clean Catch     Status: Abnormal   Collection Time: 08/08/23  1:31 PM  Result Value Ref Range   Color, Urine AMBER (A) YELLOW   APPearance CLOUDY (A) CLEAR   Specific Gravity, Urine 1.021 1.005 - 1.030   pH 7.0 5.0 - 8.0   Glucose, UA NEGATIVE NEGATIVE mg/dL   Hgb urine dipstick NEGATIVE NEGATIVE   Bilirubin Urine NEGATIVE NEGATIVE   Ketones, ur NEGATIVE NEGATIVE mg/dL   Protein, ur 30 (A) NEGATIVE mg/dL   Nitrite NEGATIVE NEGATIVE   Leukocytes,Ua  LARGE (A) NEGATIVE   RBC / HPF 21-50 0 - 5 RBC/hpf   WBC, UA 21-50 0 - 5 WBC/hpf   Bacteria, UA RARE (A) NONE SEEN   Squamous Epithelial / HPF 11-20 0 - 5 /HPF   Mucus PRESENT    Trichomonas, UA PRESENT (A) NONE SEEN  GC/Chlamydia probe amp ([place])not at [facility]     Status: None   Collection Time: 08/08/23  1:31 PM  Result Value Ref Range   Neisseria Gonorrhea Negative    Chlamydia Negative    Comment Normal Reference Ranger Chlamydia - Negative    Comment      Normal Reference Range Neisseria Gonorrhea - Negative  Wet prep, genital     Status: Abnormal   Collection Time:  08/08/23  1:50 PM  Result Value Ref Range   Yeast Wet Prep HPF POC NONE SEEN NONE SEEN   Trich, Wet Prep PRESENT (A) NONE SEEN   Clue Cells Wet Prep HPF POC NONE SEEN NONE SEEN   WBC, Wet Prep HPF POC >=10 (A) <10   Sperm NONE SEEN     Assessment and Plan  1. Trichomonal vaginitis during pregnancy in first trimester - Information provided on trichomoniasis - Prescription for: Flagyl  500 mg BID x 7 days   2. Abdominal pain affecting pregnancy - Information provided on abdominal pain in pregnancy   3. Nausea and vomiting during pregnancy - Information provided on N/V in pregnancy - Prescription for: Compazine  10 mg po every 6 hrs prn N/V  4. [redacted] weeks gestation of pregnancy  - Discharge home - Keep scheduled appt with [facility] on 08/29/2023 - Patient verbalized an understanding of the plan of care and agrees.   Ona Roehrs, CNM 08/08/2023, 4:37 PM

## 2023-08-09 LAB — GC/CHLAMYDIA PROBE AMP (~~LOC~~) NOT AT ARMC
Comment: NORMAL
Comment: NEGATIVE

## 2023-08-16 LAB — HORIZON CUSTOM: REPORT SUMMARY: NEGATIVE

## 2023-08-29 ENCOUNTER — Other Ambulatory Visit: Payer: Self-pay

## 2023-08-29 ENCOUNTER — Ambulatory Visit: Admitting: Obstetrics and Gynecology

## 2023-08-29 ENCOUNTER — Encounter: Payer: Self-pay | Admitting: Obstetrics and Gynecology

## 2023-08-29 VITALS — BP 135/82 | HR 99 | Wt 233.0 lb

## 2023-08-29 DIAGNOSIS — O0990 Supervision of high risk pregnancy, unspecified, unspecified trimester: Secondary | ICD-10-CM | POA: Diagnosis not present

## 2023-08-29 DIAGNOSIS — O0992 Supervision of high risk pregnancy, unspecified, second trimester: Secondary | ICD-10-CM | POA: Diagnosis not present

## 2023-08-29 DIAGNOSIS — Z3A16 16 weeks gestation of pregnancy: Secondary | ICD-10-CM

## 2023-08-29 DIAGNOSIS — Z98891 History of uterine scar from previous surgery: Secondary | ICD-10-CM | POA: Diagnosis not present

## 2023-08-29 DIAGNOSIS — O0930 Supervision of pregnancy with insufficient antenatal care, unspecified trimester: Secondary | ICD-10-CM

## 2023-08-29 DIAGNOSIS — Z604 Social exclusion and rejection: Secondary | ICD-10-CM

## 2023-08-29 DIAGNOSIS — O0932 Supervision of pregnancy with insufficient antenatal care, second trimester: Secondary | ICD-10-CM
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
## 2023-08-31 LAB — AFP, SERUM, OPEN SPINA BIFIDA
AFP MoM: 1.02
AFP Value: 30.3 ng/mL
Gest. Age on Collection Date: 16.5 wk
Maternal Age At EDD: 32.5 a
OSBR Risk 1 IN: 10000
Test Results:: NEGATIVE
Weight: 233 [lb_av]

## 2023-09-01 ENCOUNTER — Ambulatory Visit: Payer: Self-pay | Admitting: Family Medicine

## 2023-09-01 DIAGNOSIS — O0990 Supervision of high risk pregnancy, unspecified, unspecified trimester: Secondary | ICD-10-CM

## 2023-09-04 ENCOUNTER — Telehealth: Payer: Self-pay | Admitting: Clinical

## 2023-09-04 ENCOUNTER — Telehealth: Payer: Self-pay

## 2023-09-04 NOTE — Telephone Encounter (Signed)
 Attempt to reschedule; Left HIPPA-compliant message to call back Warren from [employer] for [employer] at [facility] at  [phone] [facility] office).

## 2023-09-12 ENCOUNTER — Encounter

## 2023-09-17 ENCOUNTER — Other Ambulatory Visit: Payer: Self-pay

## 2023-09-17 ENCOUNTER — Ambulatory Visit: Payer: Self-pay

## 2023-09-23 ENCOUNTER — Encounter

## 2023-09-26 ENCOUNTER — Encounter: Payer: Self-pay | Admitting: Family Medicine

## 2023-09-26 ENCOUNTER — Other Ambulatory Visit: Payer: Self-pay

## 2023-09-26 ENCOUNTER — Ambulatory Visit: Admitting: Certified Nurse Midwife

## 2023-09-26 VITALS — BP 110/78 | HR 114 | Wt 233.7 lb

## 2023-09-26 DIAGNOSIS — Z98891 History of uterine scar from previous surgery: Secondary | ICD-10-CM

## 2023-09-26 DIAGNOSIS — O0992 Supervision of high risk pregnancy, unspecified, second trimester: Secondary | ICD-10-CM

## 2023-09-26 DIAGNOSIS — Z3A20 20 weeks gestation of pregnancy: Secondary | ICD-10-CM | POA: Diagnosis not present
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
## 2023-10-02 ENCOUNTER — Inpatient Hospital Stay (HOSPITAL_COMMUNITY)
Admission: AD | Admit: 2023-10-02 | Discharge: 2023-10-02 | Disposition: A | Discharge status: Home / Self Care | Attending: Obstetrics and Gynecology | Admitting: Obstetrics and Gynecology

## 2023-10-02 ENCOUNTER — Encounter (HOSPITAL_COMMUNITY): Payer: Self-pay | Admitting: Obstetrics and Gynecology

## 2023-10-02 DIAGNOSIS — O23592 Infection of other part of genital tract in pregnancy, second trimester: Secondary | ICD-10-CM | POA: Insufficient documentation

## 2023-10-02 DIAGNOSIS — Z3A21 21 weeks gestation of pregnancy: Secondary | ICD-10-CM

## 2023-10-02 DIAGNOSIS — O26892 Other specified pregnancy related conditions, second trimester: Secondary | ICD-10-CM

## 2023-10-02 DIAGNOSIS — B9689 Other specified bacterial agents as the cause of diseases classified elsewhere: Secondary | ICD-10-CM

## 2023-10-02 DIAGNOSIS — N760 Acute vaginitis: Secondary | ICD-10-CM

## 2023-10-02 DIAGNOSIS — R109 Unspecified abdominal pain: Secondary | ICD-10-CM

## 2023-10-02 DIAGNOSIS — O4692 Antepartum hemorrhage, unspecified, second trimester: Secondary | ICD-10-CM | POA: Diagnosis present

## 2023-10-02 DIAGNOSIS — F17210 Nicotine dependence, cigarettes, uncomplicated: Secondary | ICD-10-CM | POA: Diagnosis not present

## 2023-10-02 DIAGNOSIS — O99332 Smoking (tobacco) complicating pregnancy, second trimester: Secondary | ICD-10-CM | POA: Diagnosis not present

## 2023-10-02 LAB — WET PREP, GENITAL
Sperm: NONE SEEN
Trich, Wet Prep: NONE SEEN
WBC, Wet Prep HPF POC: >=10 — AB (ref <10)
Yeast Wet Prep HPF POC: NONE SEEN

## 2023-10-02 LAB — URINALYSIS, ROUTINE W REFLEX MICROSCOPIC
Bilirubin Urine: NEGATIVE
Glucose, UA: NEGATIVE mg/dL
Ketones, ur: NEGATIVE mg/dL
Nitrite: NEGATIVE
Protein, ur: 100 mg/dL — AB
Specific Gravity, Urine: 1.029 (ref 1.005–1.030)
pH: 5 (ref 5.0–8.0)

## 2023-10-02 MED ORDER — METRONIDAZOLE 500 MG PO TABS: 500.0000 mg | ORAL_TABLET | Freq: Two times a day (BID) | ORAL | 0 refills | Status: DC

## 2023-10-02 NOTE — MAU Note (Signed)
 [name] is a 33 y.o. at [redacted]w[redacted]d here in MAU reporting: noticed that she had a light amount of bleeding through her shorts around 1500 today, denies clots. Has not had bleeding since. Has had on-going cramping on both sides of her lower abdomen. Denies recent IC or watery LOF. Reports +FM.  Patient complaint: Bleeding  Pain Score: 4  Pain Location: Abdomen     Onset of complaint: today LMP: No LMP recorded. Patient is pregnant.  Vitals:   10/02/23 2019  BP: 123/85  Pulse: (!) 103  Resp: 16  Temp: 98.7 F (37.1 C)  SpO2: 100%    FHT:  Fetal Heart Rate Mode: Doppler Baseline Rate (A): 138 bpm Lab orders placed from triage: UA

## 2023-10-02 NOTE — MAU Provider Note (Signed)
 History     CSN: [id number]  Arrival date and time: 10/02/23 1958   Event Date/Time   First Provider Initiated Contact with Patient 10/02/23 2059      Chief Complaint  Patient presents with   Vaginal Bleeding   Abdominal Pain   HPI [name] is a 33 y.o. year old G23P2002 female at [redacted]w[redacted]d weeks gestation who presents to MAU reporting some light vaginal bleeding through her shorts at about 1500 today. She reports no bleeding now. She does report some mild cramping. She receives [facility] with [facility]; next appt is 10/09/2023.    OB History     Gravida  3   Para  2   Term  2   Preterm      AB      Living  2      SAB      IAB      Ectopic      Multiple      Live Births  2           Past Medical History:  Diagnosis Date   Abnormal Pap smear    Anemia    Broken arm    as child   Fibroid    Late prenatal care 06/16/2012            Clinic     [facility]      Genetic Screen     too late      Anatomic US      WNL      Glucose Screen     95      TDaP vaccine     ordered      Flu vaccine           CF Screen           GBS     neg      Baby Food           Contraception           Circumcision                Medical history non-contributory    Previous cesarean delivery affecting pregnancy, antepartum 06/16/2012   States they rushed it, IOL with progress to 4cm then they said I needed a section.  May want repeat C/S but would TOLAC if labors.           Genetic Screen     Too Late      Anatomic US      Normal at 24 wks      Glucose Screen           GBS           Feeding Preference           Contraception           Circumcision                 Past Surgical History:  Procedure Laterality Date   CESAREAN SECTION  10/26/2010   Procedure: CESAREAN SECTION;  Surgeon: Truman Corona;  Location: [facility];  Service: Gynecology;  Laterality: N/A;   CESAREAN SECTION N/A 07/15/2012   Procedure: Repeat CESAREAN SECTION  scar revision;  Surgeon: Elveria Mungo, MD;  Location: [facility];  Service: Obstetrics;  Laterality: N/A;    Family History  Problem Relation Age of Onset   Healthy Mother    Stroke Father    Hypertension Father    Diabetes Maternal Uncle  Social History   Tobacco Use   Smoking status: Some Days    Types: Cigarettes   Smokeless tobacco: Never   Tobacco comments:    Pt stated trying to stop. Smokes 3 cigarettes a day.  Vaping Use   Vaping status: Never Used  Substance Use Topics   Alcohol use: No    Comment: Pt denies    Drug use: Not Currently    Types: Marijuana    Comment: THC -stopped when + preg test    Allergies: No Known Allergies  Medications Prior to Admission  Medication Sig Dispense Refill Last Dose/Taking   aspirin  EC 81 MG tablet Take 1 tablet (81 mg total) by mouth daily. Start taking when you are [redacted] weeks pregnant for rest of pregnancy for prevention of preeclampsia (Patient not taking: Reported on 09/26/2023) 300 tablet 2    metroNIDAZOLE  (FLAGYL ) 500 MG tablet Take 1 tablet (500 mg total) by mouth 2 (two) times daily. 14 tablet 0    Prenatal 27-1 MG TABS Take 1 tablet by mouth daily. 30 tablet 11    prochlorperazine  (COMPAZINE ) 10 MG tablet Take 1 tablet (10 mg total) by mouth every 6 (six) hours as needed for nausea or vomiting. 20 tablet 0    promethazine  (PHENERGAN ) 25 MG tablet Take 1 tablet (25 mg total) by mouth every 6 (six) hours as needed for nausea or vomiting. (Patient not taking: Reported on 09/26/2023) 30 tablet 0     Review of Systems  Constitutional: Negative.   HENT: Negative.    Eyes: Negative.   Respiratory: Negative.    Cardiovascular: Negative.   Gastrointestinal: Negative.   Endocrine: Negative.   Genitourinary: Negative.   Musculoskeletal: Negative.   Skin: Negative.   Allergic/Immunologic: Negative.   Hematological: Negative.   Psychiatric/Behavioral: Negative.     Physical Exam   Blood pressure 123/85, pulse (!) 103, temperature 98.7 F (37.1 C), temperature source Oral, resp.  rate 16, height 5' 7 (1.702 m), weight 105.9 kg, SpO2 100%.  Physical Exam Vitals and nursing note reviewed. Exam conducted with a chaperone present.  Constitutional:      Appearance: Normal appearance. She is normal weight.  Cardiovascular:     Rate and Rhythm: Normal rate.  Pulmonary:     Effort: Pulmonary effort is normal.  Abdominal:     Palpations: Abdomen is soft.  Genitourinary:    General: Normal vulva.     Comments: Pelvic exam: External genitalia normal, SE: vaginal walls pink and well rugated, cervix is smooth, pink, no lesions, small amt of thick, white vaginal d/c -- WP, GC/CT done, cervix visually closed, Uterus is non-tender, S=D, no CMT or friability, no adnexal tenderness.  Dilation: Closed Exam by:: Letha, CNM  Musculoskeletal:        General: Normal range of motion.  Skin:    General: Skin is warm and dry.  Neurological:     Mental Status: She is alert and oriented to person, place, and time.  Psychiatric:        Mood and Affect: Mood normal.        Behavior: Behavior normal.        Thought Content: Thought content normal.        Judgment: Judgment normal.    FHTs by doppler: 138 bpm  MAU Course  Procedures  MDM CCUA Wet Prep GC/CT -- Results pending   Results for orders placed or performed during the hospital encounter of 10/02/23 (from the past 24 hours)  Urinalysis, Routine  w reflex microscopic -Urine, Clean Catch     Status: Abnormal   Collection Time: 10/02/23  8:20 PM  Result Value Ref Range   Color, Urine AMBER (A) YELLOW   APPearance CLOUDY (A) CLEAR   Specific Gravity, Urine 1.029 1.005 - 1.030   pH 5.0 5.0 - 8.0   Glucose, UA NEGATIVE NEGATIVE mg/dL   Hgb urine dipstick MODERATE (A) NEGATIVE   Bilirubin Urine NEGATIVE NEGATIVE   Ketones, ur NEGATIVE NEGATIVE mg/dL   Protein, ur 899 (A) NEGATIVE mg/dL   Nitrite NEGATIVE NEGATIVE   Leukocytes,Ua LARGE (A) NEGATIVE   RBC / HPF 11-20 0 - 5 RBC/hpf   WBC, UA 21-50 0 - 5 WBC/hpf    Bacteria, UA FEW (A) NONE SEEN   Squamous Epithelial / HPF 21-50 0 - 5 /HPF   Mucus PRESENT   Wet prep, genital     Status: Abnormal   Collection Time: 10/02/23  9:08 PM  Result Value Ref Range   Yeast Wet Prep HPF POC NONE SEEN NONE SEEN   Trich, Wet Prep NONE SEEN NONE SEEN   Clue Cells Wet Prep HPF POC PRESENT (A) NONE SEEN   WBC, Wet Prep HPF POC >=10 (A) <10   Sperm NONE SEEN     Assessment and Plan  1. Bacterial vaginosis (Primary) - Information provided on BV - prescription for: Flagyl  500 mg po BID x 7 days   2. Spotting and cramping affecting pregnancy, antepartum - Information provided on abdominal pain in pregnancy - Explained that cramping is due to BV   3. [redacted] weeks gestation of pregnancy   - Discharge home - Keep scheduled appt with [facility] - Patient verbalized an understanding of the plan of care and agrees.  Ala Cart, CNM 10/02/2023, 8:59 PM

## 2023-10-03 LAB — GC/CHLAMYDIA PROBE AMP (~~LOC~~) NOT AT ARMC
Chlamydia: NEGATIVE
Comment: NEGATIVE
Comment: NORMAL
Neisseria Gonorrhea: NEGATIVE

## 2023-10-07 DIAGNOSIS — O280 Abnormal hematological finding on antenatal screening of mother: Secondary | ICD-10-CM | POA: Insufficient documentation

## 2023-10-07 DIAGNOSIS — O285 Abnormal chromosomal and genetic finding on antenatal screening of mother: Secondary | ICD-10-CM | POA: Insufficient documentation

## 2023-10-09 ENCOUNTER — Ambulatory Visit (HOSPITAL_BASED_OUTPATIENT_CLINIC_OR_DEPARTMENT_OTHER): Admitting: Maternal & Fetal Medicine

## 2023-10-09 ENCOUNTER — Other Ambulatory Visit: Payer: Self-pay | Admitting: *Deleted

## 2023-10-09 ENCOUNTER — Ambulatory Visit

## 2023-10-09 ENCOUNTER — Telehealth: Payer: Self-pay | Admitting: Clinical

## 2023-10-09 ENCOUNTER — Ambulatory Visit: Payer: Self-pay | Attending: Obstetrics and Gynecology

## 2023-10-09 VITALS — BP 125/76 | HR 99

## 2023-10-09 DIAGNOSIS — Z363 Encounter for antenatal screening for malformations: Secondary | ICD-10-CM | POA: Diagnosis not present

## 2023-10-09 DIAGNOSIS — O0990 Supervision of high risk pregnancy, unspecified, unspecified trimester: Secondary | ICD-10-CM

## 2023-10-09 DIAGNOSIS — O285 Abnormal chromosomal and genetic finding on antenatal screening of mother: Secondary | ICD-10-CM

## 2023-10-09 DIAGNOSIS — Z3A22 22 weeks gestation of pregnancy: Secondary | ICD-10-CM

## 2023-10-09 DIAGNOSIS — O34219 Maternal care for unspecified type scar from previous cesarean delivery: Secondary | ICD-10-CM | POA: Diagnosis not present

## 2023-10-09 DIAGNOSIS — O99212 Obesity complicating pregnancy, second trimester: Secondary | ICD-10-CM

## 2023-10-09 DIAGNOSIS — O280 Abnormal hematological finding on antenatal screening of mother: Secondary | ICD-10-CM

## 2023-10-09 DIAGNOSIS — Z3A11 11 weeks gestation of pregnancy: Secondary | ICD-10-CM | POA: Diagnosis not present

## 2023-10-09 NOTE — Telephone Encounter (Signed)
Attempt call regarding referral; Left HIPPA-compliant message to call back Mikle Sternberg from [facility] at [facility] at  [phone] (Ashelyn Mccravy's office).    

## 2023-10-09 NOTE — Progress Notes (Signed)
 [facility] at [facility] [address], [address] Phone:  [phone]   Fax:  [phone]      In-Person Genetic Counseling Clinic Note:   I spoke with 33 y.o. Lisa Byrd today to discuss her NIPS results. She was referred by Nicholaus Burnard HERO, MD. She was accompanied by FOB Lisa Byrd.   Pregnancy History:    [id number]. EGA: [redacted]w[redacted]d by US . EDD: 02/08/2024. Lisa Byrd has two healthy children. Denies major personal health concerns. Denies bleeding, infections, and fevers in this pregnancy. Denies using tobacco, alcohol, or street drugs in this pregnancy.   Family History:    A three-generation pedigree was created and scanned into Epic under the Media tab.  FOB reports he, his mother, 2 maternal sisters, and his brother have a history of umbilical hernia. He reports these occurred in adulthood and they are s/p surgical repair. We reviewed that umbilical hernia may be multifactorial or may be part of a genetic condition. Without genetic testing, recurrence risk is difficult to estimate; however, this family history appears to follow an autosomal dominant inheritance pattern and recurrence risk may be up to 50%.  Maternal ethnicity reported as Black and paternal ethnicity reported as Black. Denies Ashkenazi Jewish ancestry.  Family history not remarkable for consanguinity, individuals with birth defects, intellectual disability, autism spectrum disorder, multiple spontaneous abortions, still births, or unexplained neonatal death.   Atypical Finding of Sex Chromosomes on NIPS:  Non-invasive prenatal screening (NIPS) analyzes cell-free DNA originating from the placenta that is found in the maternal blood circulation during pregnancy to provide information regarding the presence or absence of extra DNA for chromosomes 13, 18, and 21 as well as the sex chromosomes. The lab detected an atypical finding involving the X chromosome, which could not be further  characterized and the origin could not be specified. We discussed that this atypical result may have originated from the placenta or may be a variant within Lisa Byrd's chromosomes. The finding can also be due to normal variation in the fetus or confined placental mosaicism. We discussed these findings and how they may also involve the whole or partial sex chromosomes. NIPS reports female sex is predicted which is consistent with today's ultrasound findings. We discussed common sex chromosome findings such as Turner syndrome (monosomy X) and triple X syndrome (trisomy X). This is not a complete list of all possible syndromes. We reviewed these conditions and discussed the other possible causes of the test listed above. Due to the limited information obtained through NIPS, we offered prenatal diagnosis through amniocentesis. We also discussed and offered Lisa Byrd the option of testing her chromosomes. The technical aspects of each as well as the benefits, risks, and limitations were reviewed with the patient including the 1 in 500 chance for miscarriage with amniocentesis. Finally, Lisa Byrd has the option of declining any addition testing and electing to follow the pregnancy with ultrasounds and have postnatal genetic counseling if needed with the understanding that normal ultrasounds do not rule out a genetic condition. Lisa Byrd elected to continue with ultrasounds and declined any further testing at this time.  Moreover, NIPS returned as low-risk for trisomy 13, trisomy 39, trisomy 108, triploidy, and 22q11.2 microdeletion syndrome. This screening significantly reduces but does not eliminate the chance that the current pregnancy is affected with these conditions. Please see report for details. Additionally, there are many genetic conditions that cannot be detected by NIPS.  Newborn Screening. The [place]  Newborn Screening (NBS) program  will screen all newborn babies for cystic fibrosis, spinal muscular atrophy,  hemoglobinopathies, and numerous other conditions.  Previous Testing Completed:  Negative carrier screening: Lisa Byrd previously completed carrier screening. She screened to not be a carrier for cystic fibrosis (CF), spinal muscular atrophy (SMA), alpha thalassemia, and beta hemoglobinopathies. Please see report for details. A negative result on carrier screening reduces but does not eliminate the chance of being a carrier.   Negative ms-AFP screening: Lisa Byrd previously completed a maternal serum AFP screen in this pregnancy. The result is screen negative. Please see report for details. A negative result reduces the risk that the current pregnancy has an open neural tube defect. Closed neural tube defects and some open defects may not be detected by this screen.   Plan of Care:   Declined amniocentesis. Declined maternal chromosome analysis. Added to [facility] list. Postnatal referral to pediatric genetics. Routine prenatal care.   Informed consent was obtained. All questions were answered.   50 minutes were spent on the date of the encounter in service to the patient including preparation, face-to-face consultation, discussion of test reports and available next steps, pedigree construction, genetic risk assessment, documentation, and care coordination.    Thank you for sharing in the care of [facility] with us .  Please do not hesitate to contact us  at [phone] if you have any questions.   Lauraine Bodily, MS, [facility] Certified Genetic Counselor   Genetic counseling student involved in appointment: No.

## 2023-10-09 NOTE — Progress Notes (Signed)
 After review, MFM consult with provider is not indicated for today  Lisa Nathanel Pipe, MD 10/09/2023 4:27 PM  [facility]

## 2023-10-24 ENCOUNTER — Ambulatory Visit: Admitting: Obstetrics and Gynecology

## 2023-10-24 ENCOUNTER — Other Ambulatory Visit: Payer: Self-pay

## 2023-10-24 VITALS — BP 120/74 | HR 96 | Wt 229.1 lb

## 2023-10-24 DIAGNOSIS — O0992 Supervision of high risk pregnancy, unspecified, second trimester: Secondary | ICD-10-CM

## 2023-10-24 DIAGNOSIS — Z98891 History of uterine scar from previous surgery: Secondary | ICD-10-CM | POA: Diagnosis not present

## 2023-10-24 DIAGNOSIS — Z3A24 24 weeks gestation of pregnancy: Secondary | ICD-10-CM | POA: Diagnosis not present

## 2023-10-24 NOTE — Patient Instructions (Signed)
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
## 2023-11-07 ENCOUNTER — Ambulatory Visit: Attending: Maternal & Fetal Medicine | Admitting: Obstetrics

## 2023-11-07 ENCOUNTER — Ambulatory Visit (HOSPITAL_BASED_OUTPATIENT_CLINIC_OR_DEPARTMENT_OTHER)

## 2023-11-07 ENCOUNTER — Encounter: Payer: Self-pay | Admitting: Obstetrics and Gynecology

## 2023-11-07 VITALS — BP 131/82 | HR 99

## 2023-11-07 DIAGNOSIS — O280 Abnormal hematological finding on antenatal screening of mother: Secondary | ICD-10-CM

## 2023-11-07 DIAGNOSIS — O35BXX0 Maternal care for other (suspected) fetal abnormality and damage, fetal cardiac anomalies, not applicable or unspecified: Secondary | ICD-10-CM | POA: Diagnosis not present

## 2023-11-07 DIAGNOSIS — O99212 Obesity complicating pregnancy, second trimester: Secondary | ICD-10-CM | POA: Diagnosis not present

## 2023-11-07 DIAGNOSIS — O285 Abnormal chromosomal and genetic finding on antenatal screening of mother: Secondary | ICD-10-CM | POA: Insufficient documentation

## 2023-11-07 DIAGNOSIS — E669 Obesity, unspecified: Secondary | ICD-10-CM

## 2023-11-07 DIAGNOSIS — Z3A26 26 weeks gestation of pregnancy: Secondary | ICD-10-CM

## 2023-11-07 DIAGNOSIS — Z362 Encounter for other antenatal screening follow-up: Secondary | ICD-10-CM | POA: Insufficient documentation

## 2023-11-07 DIAGNOSIS — O35EXX0 Maternal care for other (suspected) fetal abnormality and damage, fetal genitourinary anomalies, not applicable or unspecified: Secondary | ICD-10-CM

## 2023-11-07 DIAGNOSIS — O34219 Maternal care for unspecified type scar from previous cesarean delivery: Secondary | ICD-10-CM | POA: Insufficient documentation

## 2023-11-07 DIAGNOSIS — O0990 Supervision of high risk pregnancy, unspecified, unspecified trimester: Secondary | ICD-10-CM

## 2023-11-07 DIAGNOSIS — O283 Abnormal ultrasonic finding on antenatal screening of mother: Secondary | ICD-10-CM | POA: Diagnosis not present

## 2023-11-07 NOTE — Progress Notes (Signed)
 MFM Consult Note  Lisa Byrd is currently at 26 weeks and 5 days.  She has been followed due to maternal obesity with a BMI of 36.7 and due to atypical findings noted on her cell free DNA test which involves the X chromosome and could not be further characterized.  The origin of the atypical finding could not be specified.  Her cell free DNA test indicated a low risk for trisomy 21, 18, and 13.  A female fetus is predicted.  She denies any problems since her last exam and reports feeling fetal movements throughout the day.  Sonographic findings Single intrauterine pregnancy at 26w 5d.  Fetal cardiac activity:  Observed and appears normal. Presentation: Cephalic. Interval fetal anatomy appears normal. Fetal biometry shows the estimated fetal weight of 2 pounds 1 ounces which measures at the 28th percentile. Amniotic fluid volume: Within normal limits. MVP: 8.03 cm. Placenta: Anterior.  There are limitations of prenatal ultrasound such as the inability to detect certain abnormalities due to poor visualization. Various factors such as fetal position, gestational age and maternal body habitus may increase the difficulty in visualizing the fetal anatomy.    On today's exam, an intracardiac echogenic focus was noted in the left ventricle of the fetal heart.    The small association between an echogenic focus and Down syndrome was discussed.   The patient was advised that it is uncertain why an atypical finding involving the X chromosome was noted on her cell free DNA test.    Due to the echogenic focus noted today and her abnormal cell free DNA test, the patient was offered and declined an amniocentesis today for definitive diagnosis of fetal chromosomal abnormalities.  She will have her baby tested after birth.  Due to the potential for confined placental mosaicism as a cause for her abnormal cell free DNA test and its association with IUGR, we will continue to follow her with growth  ultrasounds throughout her pregnancy.    She will return in 4 weeks for another growth scan.    The patient stated that all of her questions were answered today.  A total of 30 minutes was spent counseling and coordinating the care for this patient.  Greater than 50% of the time was spent in direct face-to-face contact.

## 2023-11-13 ENCOUNTER — Other Ambulatory Visit: Payer: Self-pay

## 2023-11-13 DIAGNOSIS — O0990 Supervision of high risk pregnancy, unspecified, unspecified trimester: Secondary | ICD-10-CM

## 2023-11-20 ENCOUNTER — Ambulatory Visit (INDEPENDENT_AMBULATORY_CARE_PROVIDER_SITE_OTHER): Admitting: Family Medicine

## 2023-11-20 ENCOUNTER — Other Ambulatory Visit: Payer: Self-pay

## 2023-11-20 ENCOUNTER — Other Ambulatory Visit

## 2023-11-20 ENCOUNTER — Other Ambulatory Visit (HOSPITAL_COMMUNITY)
Admission: RE | Admit: 2023-11-20 | Discharge: 2023-11-20 | Disposition: A | Discharge status: Home / Self Care | Source: Ambulatory Visit | Attending: Family Medicine | Admitting: Family Medicine

## 2023-11-20 VITALS — BP 111/79 | HR 93 | Wt 224.0 lb

## 2023-11-20 DIAGNOSIS — O26893 Other specified pregnancy related conditions, third trimester: Secondary | ICD-10-CM | POA: Insufficient documentation

## 2023-11-20 DIAGNOSIS — N898 Other specified noninflammatory disorders of vagina: Secondary | ICD-10-CM | POA: Diagnosis not present

## 2023-11-20 DIAGNOSIS — O0993 Supervision of high risk pregnancy, unspecified, third trimester: Secondary | ICD-10-CM

## 2023-11-20 DIAGNOSIS — O285 Abnormal chromosomal and genetic finding on antenatal screening of mother: Secondary | ICD-10-CM | POA: Diagnosis not present

## 2023-11-20 DIAGNOSIS — Z3A28 28 weeks gestation of pregnancy: Secondary | ICD-10-CM

## 2023-11-20 DIAGNOSIS — O0990 Supervision of high risk pregnancy, unspecified, unspecified trimester: Secondary | ICD-10-CM

## 2023-11-20 DIAGNOSIS — Z98891 History of uterine scar from previous surgery: Secondary | ICD-10-CM

## 2023-11-21 LAB — CERVICOVAGINAL ANCILLARY ONLY
Bacterial Vaginitis (gardnerella): POSITIVE — AB
Candida Glabrata: NEGATIVE
Candida Vaginitis: NEGATIVE
Chlamydia: NEGATIVE
Comment: NEGATIVE
Comment: NEGATIVE
Comment: NEGATIVE
Comment: NEGATIVE
Comment: NEGATIVE
Comment: NORMAL
Neisseria Gonorrhea: NEGATIVE
Trichomonas: POSITIVE — AB
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
## 2023-11-22 ENCOUNTER — Other Ambulatory Visit: Payer: Self-pay

## 2023-11-22 ENCOUNTER — Ambulatory Visit: Payer: Self-pay | Admitting: Family Medicine

## 2023-11-22 ENCOUNTER — Other Ambulatory Visit

## 2023-11-22 DIAGNOSIS — Z3A28 28 weeks gestation of pregnancy: Secondary | ICD-10-CM

## 2023-11-22 MED ORDER — METRONIDAZOLE 500 MG PO TABS: 500.0000 mg | ORAL_TABLET | Freq: Two times a day (BID) | ORAL | 0 refills | Status: AC

## 2023-11-23 LAB — CBC
Hematocrit: 37.5 % (ref 34.0–46.6)
Hemoglobin: 12.4 g/dL (ref 11.1–15.9)
MCH: 31.2 pg (ref 26.6–33.0)
MCHC: 33.1 g/dL (ref 31.5–35.7)
MCV: 95 fL (ref 79–97)
Platelets: 256 x10E3/uL (ref 150–450)
RBC: 3.97 x10E6/uL (ref 3.77–5.28)
RDW: 12.4 % (ref 11.7–15.4)
WBC: 8.3 x10E3/uL (ref 3.4–10.8)

## 2023-11-23 LAB — GLUCOSE TOLERANCE, 2 HOURS W/ 1HR
Glucose, 1 hour: 146 mg/dL (ref 70–179)
Glucose, 2 hour: 109 mg/dL (ref 70–152)
Glucose, Fasting: 87 mg/dL (ref 70–91)

## 2023-11-23 LAB — HIV ANTIBODY (ROUTINE TESTING W REFLEX): HIV Screen 4th Generation wRfx: NONREACTIVE

## 2023-11-23 LAB — RPR: RPR Ser Ql: NONREACTIVE

## 2023-11-25 ENCOUNTER — Ambulatory Visit: Payer: Self-pay | Admitting: Family Medicine

## 2023-11-25 DIAGNOSIS — O0990 Supervision of high risk pregnancy, unspecified, unspecified trimester: Secondary | ICD-10-CM

## 2023-12-06 ENCOUNTER — Encounter: Payer: Self-pay | Admitting: Family Medicine

## 2023-12-06 ENCOUNTER — Ambulatory Visit (INDEPENDENT_AMBULATORY_CARE_PROVIDER_SITE_OTHER): Admitting: Family Medicine

## 2023-12-06 VITALS — BP 114/81 | HR 98 | Wt 229.9 lb

## 2023-12-06 DIAGNOSIS — Z3009 Encounter for other general counseling and advice on contraception: Secondary | ICD-10-CM | POA: Diagnosis not present

## 2023-12-06 DIAGNOSIS — O0993 Supervision of high risk pregnancy, unspecified, third trimester: Secondary | ICD-10-CM | POA: Diagnosis not present

## 2023-12-06 DIAGNOSIS — O285 Abnormal chromosomal and genetic finding on antenatal screening of mother: Secondary | ICD-10-CM

## 2023-12-06 DIAGNOSIS — Z98891 History of uterine scar from previous surgery: Secondary | ICD-10-CM

## 2023-12-06 DIAGNOSIS — O26893 Other specified pregnancy related conditions, third trimester: Secondary | ICD-10-CM | POA: Diagnosis not present

## 2023-12-06 DIAGNOSIS — Z23 Encounter for immunization: Secondary | ICD-10-CM | POA: Diagnosis not present

## 2023-12-06 DIAGNOSIS — Z3A30 30 weeks gestation of pregnancy: Secondary | ICD-10-CM

## 2023-12-06 DIAGNOSIS — N898 Other specified noninflammatory disorders of vagina: Secondary | ICD-10-CM

## 2023-12-06 DIAGNOSIS — O0990 Supervision of high risk pregnancy, unspecified, unspecified trimester: Secondary | ICD-10-CM
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
## 2023-12-09 ENCOUNTER — Encounter: Payer: Self-pay | Admitting: *Deleted

## 2023-12-18 ENCOUNTER — Ambulatory Visit (INDEPENDENT_AMBULATORY_CARE_PROVIDER_SITE_OTHER): Admitting: Obstetrics and Gynecology

## 2023-12-18 ENCOUNTER — Ambulatory Visit: Attending: Maternal & Fetal Medicine | Admitting: Maternal & Fetal Medicine

## 2023-12-18 ENCOUNTER — Other Ambulatory Visit: Payer: Self-pay

## 2023-12-18 ENCOUNTER — Other Ambulatory Visit: Payer: Self-pay | Admitting: *Deleted

## 2023-12-18 ENCOUNTER — Ambulatory Visit

## 2023-12-18 VITALS — BP 127/75 | HR 74

## 2023-12-18 VITALS — BP 132/87 | HR 81 | Wt 227.0 lb

## 2023-12-18 DIAGNOSIS — Z3A32 32 weeks gestation of pregnancy: Secondary | ICD-10-CM

## 2023-12-18 DIAGNOSIS — O0990 Supervision of high risk pregnancy, unspecified, unspecified trimester: Secondary | ICD-10-CM | POA: Diagnosis not present

## 2023-12-18 DIAGNOSIS — Z98891 History of uterine scar from previous surgery: Secondary | ICD-10-CM | POA: Diagnosis not present

## 2023-12-18 DIAGNOSIS — O0993 Supervision of high risk pregnancy, unspecified, third trimester: Secondary | ICD-10-CM | POA: Diagnosis not present

## 2023-12-18 DIAGNOSIS — O0930 Supervision of pregnancy with insufficient antenatal care, unspecified trimester: Secondary | ICD-10-CM | POA: Diagnosis not present

## 2023-12-18 DIAGNOSIS — O23593 Infection of other part of genital tract in pregnancy, third trimester: Secondary | ICD-10-CM

## 2023-12-18 DIAGNOSIS — O99213 Obesity complicating pregnancy, third trimester: Secondary | ICD-10-CM | POA: Diagnosis not present

## 2023-12-18 DIAGNOSIS — O99212 Obesity complicating pregnancy, second trimester: Secondary | ICD-10-CM | POA: Diagnosis not present

## 2023-12-18 DIAGNOSIS — Z362 Encounter for other antenatal screening follow-up: Secondary | ICD-10-CM | POA: Diagnosis not present

## 2023-12-18 DIAGNOSIS — O285 Abnormal chromosomal and genetic finding on antenatal screening of mother: Secondary | ICD-10-CM | POA: Insufficient documentation

## 2023-12-18 DIAGNOSIS — A5901 Trichomonal vulvovaginitis: Secondary | ICD-10-CM

## 2023-12-18 DIAGNOSIS — O34211 Maternal care for low transverse scar from previous cesarean delivery: Secondary | ICD-10-CM | POA: Insufficient documentation

## 2023-12-18 DIAGNOSIS — Z3009 Encounter for other general counseling and advice on contraception: Secondary | ICD-10-CM | POA: Diagnosis not present

## 2023-12-18 DIAGNOSIS — E669 Obesity, unspecified: Secondary | ICD-10-CM | POA: Diagnosis not present

## 2023-12-18 DIAGNOSIS — O34219 Maternal care for unspecified type scar from previous cesarean delivery: Secondary | ICD-10-CM

## 2023-12-18 HISTORY — DX: Trichomonal vulvovaginitis: A59.01
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
## 2023-12-19 ENCOUNTER — Encounter

## 2024-01-01 ENCOUNTER — Ambulatory Visit (INDEPENDENT_AMBULATORY_CARE_PROVIDER_SITE_OTHER): Admitting: Certified Nurse Midwife

## 2024-01-01 ENCOUNTER — Other Ambulatory Visit (HOSPITAL_COMMUNITY)
Admission: RE | Admit: 2024-01-01 | Discharge: 2024-01-01 | Disposition: A | Source: Ambulatory Visit | Attending: Obstetrics and Gynecology | Admitting: Obstetrics and Gynecology

## 2024-01-01 ENCOUNTER — Other Ambulatory Visit: Payer: Self-pay

## 2024-01-01 VITALS — BP 122/83 | HR 97 | Wt 234.4 lb

## 2024-01-01 DIAGNOSIS — O26899 Other specified pregnancy related conditions, unspecified trimester: Secondary | ICD-10-CM

## 2024-01-01 DIAGNOSIS — R12 Heartburn: Secondary | ICD-10-CM | POA: Diagnosis not present

## 2024-01-01 DIAGNOSIS — O23593 Infection of other part of genital tract in pregnancy, third trimester: Secondary | ICD-10-CM | POA: Diagnosis not present

## 2024-01-01 DIAGNOSIS — O26893 Other specified pregnancy related conditions, third trimester: Secondary | ICD-10-CM

## 2024-01-01 DIAGNOSIS — O0990 Supervision of high risk pregnancy, unspecified, unspecified trimester: Secondary | ICD-10-CM

## 2024-01-01 DIAGNOSIS — A5901 Trichomonal vulvovaginitis: Secondary | ICD-10-CM | POA: Diagnosis not present

## 2024-01-01 DIAGNOSIS — R102 Pelvic and perineal pain unspecified side: Secondary | ICD-10-CM

## 2024-01-01 DIAGNOSIS — Z3A34 34 weeks gestation of pregnancy: Secondary | ICD-10-CM | POA: Diagnosis not present

## 2024-01-01 DIAGNOSIS — O0993 Supervision of high risk pregnancy, unspecified, third trimester: Secondary | ICD-10-CM

## 2024-01-01 MED ORDER — OMEPRAZOLE 40 MG PO CPDR: 40.0000 mg | DELAYED_RELEASE_CAPSULE | Freq: Every day | ORAL | 6 refills | Status: DC

## 2024-01-02 ENCOUNTER — Encounter: Admitting: Physician Assistant

## 2024-01-02 LAB — CERVICOVAGINAL ANCILLARY ONLY
Bacterial Vaginitis (gardnerella): NEGATIVE
Candida Glabrata: NEGATIVE
Candida Vaginitis: NEGATIVE
Chlamydia: NEGATIVE
Comment: NEGATIVE
Comment: NEGATIVE
Comment: NEGATIVE
Comment: NEGATIVE
Comment: NEGATIVE
Comment: NORMAL
Neisseria Gonorrhea: NEGATIVE
Trichomonas: NEGATIVE
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
## 2024-01-16 ENCOUNTER — Ambulatory Visit: Admitting: Obstetrics and Gynecology

## 2024-01-16 ENCOUNTER — Encounter: Payer: Self-pay | Admitting: Obstetrics and Gynecology

## 2024-01-16 ENCOUNTER — Other Ambulatory Visit: Payer: Self-pay

## 2024-01-16 VITALS — BP 124/86 | HR 105 | Wt 233.3 lb

## 2024-01-16 DIAGNOSIS — Z3A36 36 weeks gestation of pregnancy: Secondary | ICD-10-CM

## 2024-01-16 DIAGNOSIS — O0933 Supervision of pregnancy with insufficient antenatal care, third trimester: Secondary | ICD-10-CM | POA: Diagnosis not present

## 2024-01-16 DIAGNOSIS — A5901 Trichomonal vulvovaginitis: Secondary | ICD-10-CM | POA: Diagnosis not present

## 2024-01-16 DIAGNOSIS — O285 Abnormal chromosomal and genetic finding on antenatal screening of mother: Secondary | ICD-10-CM

## 2024-01-16 DIAGNOSIS — Z3009 Encounter for other general counseling and advice on contraception: Secondary | ICD-10-CM

## 2024-01-16 DIAGNOSIS — Z98891 History of uterine scar from previous surgery: Secondary | ICD-10-CM | POA: Diagnosis not present

## 2024-01-16 DIAGNOSIS — O0930 Supervision of pregnancy with insufficient antenatal care, unspecified trimester: Secondary | ICD-10-CM

## 2024-01-16 DIAGNOSIS — O23593 Infection of other part of genital tract in pregnancy, third trimester: Secondary | ICD-10-CM | POA: Diagnosis not present

## 2024-01-16 DIAGNOSIS — O0993 Supervision of high risk pregnancy, unspecified, third trimester: Secondary | ICD-10-CM

## 2024-01-16 DIAGNOSIS — O0990 Supervision of high risk pregnancy, unspecified, unspecified trimester: Secondary | ICD-10-CM

## 2024-01-16 DIAGNOSIS — T7491XS Unspecified adult maltreatment, confirmed, sequela: Secondary | ICD-10-CM

## 2024-01-16 NOTE — Addendum Note (Signed)
 Addended by: GEOFFREY DEVON SAUNDERS on: 01/16/2024 01:14 PM   Modules accepted: Orders
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
## 2024-01-16 NOTE — Progress Notes (Signed)
 Patients Omeprazole  is not covered by insurance. 3-4 braxton hicks contraction in a day.

## 2024-01-19 ENCOUNTER — Encounter: Payer: Self-pay | Admitting: Obstetrics and Gynecology

## 2024-01-20 ENCOUNTER — Ambulatory Visit (HOSPITAL_BASED_OUTPATIENT_CLINIC_OR_DEPARTMENT_OTHER): Admitting: Obstetrics and Gynecology

## 2024-01-20 ENCOUNTER — Telehealth: Payer: Self-pay | Admitting: Family Medicine

## 2024-01-20 ENCOUNTER — Ambulatory Visit: Attending: Obstetrics and Gynecology

## 2024-01-20 VITALS — BP 133/83 | HR 86

## 2024-01-20 DIAGNOSIS — O34219 Maternal care for unspecified type scar from previous cesarean delivery: Secondary | ICD-10-CM | POA: Diagnosis not present

## 2024-01-20 DIAGNOSIS — O285 Abnormal chromosomal and genetic finding on antenatal screening of mother: Secondary | ICD-10-CM

## 2024-01-20 DIAGNOSIS — Z3A37 37 weeks gestation of pregnancy: Secondary | ICD-10-CM

## 2024-01-20 DIAGNOSIS — O99213 Obesity complicating pregnancy, third trimester: Secondary | ICD-10-CM | POA: Diagnosis not present

## 2024-01-20 DIAGNOSIS — O0990 Supervision of high risk pregnancy, unspecified, unspecified trimester: Secondary | ICD-10-CM | POA: Diagnosis not present

## 2024-01-20 DIAGNOSIS — Z98891 History of uterine scar from previous surgery: Secondary | ICD-10-CM

## 2024-01-20 DIAGNOSIS — E669 Obesity, unspecified: Secondary | ICD-10-CM | POA: Diagnosis not present

## 2024-01-20 DIAGNOSIS — O283 Abnormal ultrasonic finding on antenatal screening of mother: Secondary | ICD-10-CM | POA: Diagnosis not present

## 2024-01-20 LAB — CULTURE, BETA STREP (GROUP B ONLY): Strep Gp B Culture: NEGATIVE

## 2024-01-20 NOTE — Telephone Encounter (Signed)
 Patient came in the office because she says that her c-section is supposed to be scheduled and she would like to know where she is on the waiting list for it to be scheduled.

## 2024-01-20 NOTE — Progress Notes (Signed)
 Maternal-Fetal Medicine Consultation  Name: Lisa Byrd  MRN: [id number]  GA: [id number] [redacted]w[redacted]d   Patient is here for fetal growth assessment. Obstetrical history significant for 2 term cesarean deliveries.  Ultrasound Normal fetal growth and amniotic fluid.  Cephalic presentation.  Placenta is anterior and there is no evidence of previa or placenta accreta spectrum.  Patient will be having repeat cesarean delivery at [redacted] weeks gestation.  She asked whether she could have early term delivery.  I counseled the patient that in the absence of risk factors including classical cesarean delivery, delivery at [redacted] weeks gestation is reasonable.  Repeat cesarean delivery is increased the risks of placenta previa and/or placenta accreta spectrum.  Long-term complications include small bowel obstruction.    Recommendations -No follow-up appointments were made.     Consultation including face-to-face (more than 50%) counseling 20 minutes.

## 2024-01-21 ENCOUNTER — Ambulatory Visit: Payer: Self-pay | Admitting: Obstetrics and Gynecology

## 2024-01-22 ENCOUNTER — Ambulatory Visit

## 2024-01-22 ENCOUNTER — Telehealth (HOSPITAL_COMMUNITY): Payer: Self-pay | Admitting: *Deleted

## 2024-01-22 ENCOUNTER — Encounter (HOSPITAL_COMMUNITY): Payer: Self-pay

## 2024-01-22 NOTE — Patient Instructions (Signed)
 Lisa Byrd  01/22/2024   Your procedure is scheduled on:  02/02/2024  Arrive at 0700 at Entrance C on [address] at [facility]  and [address]. You are invited to use the FREE valet parking or use the Visitor's parking deck.  Pick up the phone at the desk and dial  [phone].  Call this number if you have problems the morning of surgery: [phone]  Remember:   Do not eat food:(After Midnight) Desps de medianoche.  You may drink clear liquids until  ___0500__.  Clear liquids means a liquid you can see thru.  It can have color such as Cola or Kool aid.  Tea is OK and coffee as long as no milk or creamer of any kind.  Take these medicines the morning of surgery with A SIP OF WATER:  none   Do not wear jewelry, make-up or nail polish.  Do not wear lotions, powders, or perfumes. Do not wear deodorant.  Do not shave 48 hours prior to surgery.  Do not bring valuables to the hospital.  [facility] is not   responsible for any belongings or valuables brought to the hospital.  Contacts, dentures or bridgework may not be worn into surgery.  Leave suitcase in the car. After surgery it may be brought to your room.  For patients admitted to the hospital, checkout time is 11:00 AM the day of              discharge.      Please read over the following fact sheets that you were given:     Preparing for Surgery

## 2024-01-22 NOTE — Telephone Encounter (Signed)
 Preadmission screen

## 2024-01-23 ENCOUNTER — Other Ambulatory Visit: Payer: Self-pay

## 2024-01-23 ENCOUNTER — Ambulatory Visit: Admitting: Obstetrics & Gynecology

## 2024-01-23 ENCOUNTER — Encounter: Payer: Self-pay | Admitting: Obstetrics & Gynecology

## 2024-01-23 ENCOUNTER — Telehealth (HOSPITAL_COMMUNITY): Payer: Self-pay | Admitting: *Deleted

## 2024-01-23 ENCOUNTER — Encounter: Admitting: Certified Nurse Midwife

## 2024-01-23 VITALS — BP 130/86 | HR 116 | Wt 237.0 lb

## 2024-01-23 DIAGNOSIS — Z3A37 37 weeks gestation of pregnancy: Secondary | ICD-10-CM

## 2024-01-23 DIAGNOSIS — Z3009 Encounter for other general counseling and advice on contraception: Secondary | ICD-10-CM

## 2024-01-23 DIAGNOSIS — Z2911 Encounter for prophylactic immunotherapy for respiratory syncytial virus (RSV): Secondary | ICD-10-CM

## 2024-01-23 DIAGNOSIS — O0993 Supervision of high risk pregnancy, unspecified, third trimester: Secondary | ICD-10-CM | POA: Diagnosis not present

## 2024-01-23 DIAGNOSIS — Z23 Encounter for immunization: Secondary | ICD-10-CM

## 2024-01-23 DIAGNOSIS — Z98891 History of uterine scar from previous surgery: Secondary | ICD-10-CM

## 2024-01-23 DIAGNOSIS — O0990 Supervision of high risk pregnancy, unspecified, unspecified trimester: Secondary | ICD-10-CM

## 2024-01-23 NOTE — Patient Instructions (Signed)
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
## 2024-01-23 NOTE — Telephone Encounter (Signed)
 Preadmission screen

## 2024-01-24 ENCOUNTER — Telehealth (HOSPITAL_COMMUNITY): Payer: Self-pay | Admitting: *Deleted

## 2024-01-24 NOTE — Telephone Encounter (Signed)
 Preadmission screen

## 2024-01-27 ENCOUNTER — Telehealth (HOSPITAL_COMMUNITY): Payer: Self-pay | Admitting: *Deleted

## 2024-01-27 ENCOUNTER — Inpatient Hospital Stay (HOSPITAL_COMMUNITY)
Admission: AD | Admit: 2024-01-27 | Discharge: 2024-01-27 | Discharge status: Against Medical Advice | Attending: Obstetrics & Gynecology | Admitting: Obstetrics & Gynecology

## 2024-01-27 DIAGNOSIS — Z5321 Procedure and treatment not carried out due to patient leaving prior to being seen by health care provider: Secondary | ICD-10-CM | POA: Diagnosis not present

## 2024-01-27 DIAGNOSIS — Z3A38 38 weeks gestation of pregnancy: Secondary | ICD-10-CM | POA: Diagnosis not present

## 2024-01-27 DIAGNOSIS — O212 Late vomiting of pregnancy: Secondary | ICD-10-CM | POA: Diagnosis present

## 2024-01-27 LAB — URINALYSIS, ROUTINE W REFLEX MICROSCOPIC
Bacteria, UA: NONE SEEN
Bilirubin Urine: NEGATIVE
Glucose, UA: NEGATIVE mg/dL
Hgb urine dipstick: NEGATIVE
Ketones, ur: 20 mg/dL — AB
Nitrite: NEGATIVE
Protein, ur: 30 mg/dL — AB
Specific Gravity, Urine: 1.021 (ref 1.005–1.030)
pH: 6 (ref 5.0–8.0)

## 2024-01-27 MED ORDER — ONDANSETRON 4 MG PO TBDP
8.0000 mg | ORAL_TABLET | Freq: Once | ORAL | Status: AC
  Administered 2024-01-27: 13:00:00 8 mg via ORAL
  Filled 2024-01-27: qty 2

## 2024-01-27 NOTE — Telephone Encounter (Signed)
 Preadmission screen

## 2024-01-27 NOTE — MAU Note (Signed)
First call-patient not in lobby 

## 2024-01-27 NOTE — MAU Note (Signed)
2nd call - patient not in lobby

## 2024-01-27 NOTE — Progress Notes (Signed)
 Patient with complaint of nausea in pregnancy. Zofran  ordered and administered by RN staff. Patient left prior to reassessment. Called 3x by RN staff per protocol.CNM attempted to call patient via phone, no answer or voicemail set up.   Patient has OB appointment 01/30/24.   Camie Rote, MSN, CNM, RNC-OB Certified Nurse Midwife, [facility] 01/27/2024 7:35 PM

## 2024-01-27 NOTE — MAU Note (Signed)
.  Lisa Byrd is a 33 y.o. at [redacted]w[redacted]d here in MAU reporting: vomiting everything she eats and drinks since yesterday. Denies vaginal bleeding or LOF. Reports +FM  Onset of complaint:  Pain score: denies Vitals:   01/27/24 1127  BP: 130/78  Pulse: 94  Resp: 16  Temp: 98.8 F (37.1 C)  SpO2: 96%     FHT:144 Lab orders placed from triage:   ua

## 2024-01-27 NOTE — MAU Note (Signed)
 3RD CALL patient not in lobby

## 2024-01-29 ENCOUNTER — Telehealth (HOSPITAL_COMMUNITY): Payer: Self-pay | Admitting: *Deleted

## 2024-01-29 NOTE — Telephone Encounter (Signed)
 Preadmission screen

## 2024-01-30 ENCOUNTER — Other Ambulatory Visit: Payer: Self-pay

## 2024-01-30 ENCOUNTER — Ambulatory Visit: Admitting: Student

## 2024-01-30 VITALS — BP 125/84 | HR 105 | Wt 259.6 lb

## 2024-01-30 DIAGNOSIS — Z3A38 38 weeks gestation of pregnancy: Secondary | ICD-10-CM | POA: Diagnosis not present

## 2024-01-30 DIAGNOSIS — O0990 Supervision of high risk pregnancy, unspecified, unspecified trimester: Secondary | ICD-10-CM

## 2024-01-30 DIAGNOSIS — R030 Elevated blood-pressure reading, without diagnosis of hypertension: Secondary | ICD-10-CM | POA: Insufficient documentation

## 2024-01-30 DIAGNOSIS — Z98891 History of uterine scar from previous surgery: Secondary | ICD-10-CM

## 2024-01-30 DIAGNOSIS — O0993 Supervision of high risk pregnancy, unspecified, third trimester: Secondary | ICD-10-CM | POA: Diagnosis not present

## 2024-01-30 NOTE — Assessment & Plan Note (Addendum)
-  Intake BP mild range today. Repeat normal. Patient asymptomatic. Per patient, she states her BP was elevated during her last visit & they had to recheck but only 1 BP documented.  -Reviewed options with patient. Preference is for MAU evaluation.

## 2024-01-30 NOTE — Assessment & Plan Note (Addendum)
-  Scheduled for RCS Sunday
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
## 2024-01-30 NOTE — Assessment & Plan Note (Addendum)
-  Denies contractions, LOF, or vaginal bleeding. Reports good fetal movement.

## 2024-01-31 ENCOUNTER — Encounter (HOSPITAL_COMMUNITY)
Admission: RE | Admit: 2024-01-31 | Discharge: 2024-01-31 | Disposition: A | Discharge status: Home / Self Care | Source: Ambulatory Visit | Attending: Obstetrics and Gynecology | Admitting: Obstetrics and Gynecology

## 2024-01-31 ENCOUNTER — Encounter: Payer: Self-pay | Admitting: Obstetrics and Gynecology

## 2024-01-31 ENCOUNTER — Other Ambulatory Visit: Payer: Self-pay | Admitting: Obstetrics and Gynecology

## 2024-01-31 ENCOUNTER — Encounter (HOSPITAL_COMMUNITY): Payer: Self-pay

## 2024-01-31 DIAGNOSIS — Z01812 Encounter for preprocedural laboratory examination: Secondary | ICD-10-CM | POA: Insufficient documentation

## 2024-01-31 DIAGNOSIS — Z6841 Body Mass Index (BMI) 40.0 and over, adult: Secondary | ICD-10-CM | POA: Insufficient documentation

## 2024-01-31 HISTORY — DX: Other complications of anesthesia, initial encounter: T88.59XA

## 2024-01-31 LAB — CBC
HCT: 37.3 % (ref 36.0–46.0)
Hemoglobin: 11.6 g/dL — ABNORMAL LOW (ref 12.0–15.0)
MCH: 30.5 pg (ref 26.0–34.0)
MCHC: 31.1 g/dL (ref 30.0–36.0)
MCV: 98.2 fL (ref 80.0–100.0)
Platelets: 228 K/uL (ref 150–400)
RBC: 3.8 MIL/uL — ABNORMAL LOW (ref 3.87–5.11)
RDW: 13.6 % (ref 11.5–15.5)
WBC: 8.1 K/uL (ref 4.0–10.5)
nRBC: 0 % (ref 0.0–0.2)

## 2024-01-31 LAB — TYPE AND SCREEN
ABO/RH(D): B POS
Antibody Screen: NEGATIVE

## 2024-02-01 LAB — SYPHILIS: RPR W/REFLEX TO RPR TITER AND TREPONEMAL ANTIBODIES, TRADITIONAL SCREENING AND DIAGNOSIS ALGORITHM: RPR Ser Ql: NONREACTIVE

## 2024-02-01 NOTE — Anesthesia Preprocedure Evaluation (Signed)
 "                                  Anesthesia Evaluation  Patient identified by MRN, date of birth, ID band Patient awake    Reviewed: Allergy & Precautions, NPO status , Patient's Chart, lab work & pertinent test results  History of Anesthesia Complications (+) history of anesthetic complications  Airway Mallampati: IV  TM Distance: >3 FB Neck ROM: Full    Dental  (+) Dental Advisory Given, Teeth Intact   Pulmonary Current Smoker   Pulmonary exam normal breath sounds clear to auscultation       Cardiovascular hypertension, Normal cardiovascular exam Rhythm:Regular Rate:Normal     Neuro/Psych negative neurological ROS  negative psych ROS   GI/Hepatic Neg liver ROS,GERD  ,,  Endo/Other  Obesity  Renal/GU negative Renal ROS  negative genitourinary   Musculoskeletal negative musculoskeletal ROS (+)    Abdominal  (+) + obese  Peds  Hematology  (+) Blood dyscrasia, anemia Lab Results      Component                Value               Date                      WBC                      8.1                 01/31/2024                HGB                      11.6 (L)            01/31/2024                HCT                      37.3                01/31/2024                MCV                      98.2                01/31/2024                PLT                      228                 01/31/2024              Anesthesia Other Findings   Reproductive/Obstetrics (+) Pregnancy Hx/o Previous C/Sections x 2 Desires sterilization                              Anesthesia Physical Anesthesia Plan  ASA: 2  Anesthesia Plan: Spinal   Post-op Pain Management: Minimal or no pain anticipated   Induction: Intravenous  PONV Risk Score and Plan: 3 and Treatment may vary due to age or medical condition, Ondansetron  and Dexamethasone   Airway Management Planned: Natural Airway  Additional Equipment: None  Intra-op Plan:    Post-operative Plan:   Informed Consent: I have reviewed the patients History and Physical, chart, labs and discussed the procedure including the risks, benefits and alternatives for the proposed anesthesia with the patient or authorized representative who has indicated his/her understanding and acceptance.     Dental advisory given  Plan Discussed with: CRNA and Anesthesiologist  Anesthesia Plan Comments:          Anesthesia Quick Evaluation  "

## 2024-02-02 ENCOUNTER — Encounter (HOSPITAL_COMMUNITY): Payer: Self-pay | Admitting: Obstetrics and Gynecology

## 2024-02-02 ENCOUNTER — Inpatient Hospital Stay (HOSPITAL_COMMUNITY)
Admission: RE | Admit: 2024-02-02 | Discharge: 2024-02-04 | DRG: 788 | Disposition: A | Discharge status: Home / Self Care | Payer: No Typology Code available for payment source | Attending: Obstetrics and Gynecology | Admitting: Obstetrics and Gynecology

## 2024-02-02 ENCOUNTER — Inpatient Hospital Stay (HOSPITAL_COMMUNITY): Payer: Self-pay | Admitting: Anesthesiology

## 2024-02-02 ENCOUNTER — Encounter (HOSPITAL_COMMUNITY): Admission: RE | Disposition: A | Payer: Self-pay | Attending: Obstetrics and Gynecology

## 2024-02-02 ENCOUNTER — Other Ambulatory Visit: Payer: Self-pay

## 2024-02-02 DIAGNOSIS — O9902 Anemia complicating childbirth: Secondary | ICD-10-CM | POA: Diagnosis present

## 2024-02-02 DIAGNOSIS — K219 Gastro-esophageal reflux disease without esophagitis: Secondary | ICD-10-CM | POA: Diagnosis present

## 2024-02-02 DIAGNOSIS — O99214 Obesity complicating childbirth: Secondary | ICD-10-CM | POA: Diagnosis present

## 2024-02-02 DIAGNOSIS — O99334 Smoking (tobacco) complicating childbirth: Secondary | ICD-10-CM | POA: Diagnosis present

## 2024-02-02 DIAGNOSIS — Z8249 Family history of ischemic heart disease and other diseases of the circulatory system: Secondary | ICD-10-CM | POA: Diagnosis not present

## 2024-02-02 DIAGNOSIS — O134 Gestational [pregnancy-induced] hypertension without significant proteinuria, complicating childbirth: Secondary | ICD-10-CM | POA: Diagnosis present

## 2024-02-02 DIAGNOSIS — Z3A39 39 weeks gestation of pregnancy: Secondary | ICD-10-CM

## 2024-02-02 DIAGNOSIS — O9962 Diseases of the digestive system complicating childbirth: Secondary | ICD-10-CM | POA: Diagnosis present

## 2024-02-02 DIAGNOSIS — O34211 Maternal care for low transverse scar from previous cesarean delivery: Secondary | ICD-10-CM | POA: Diagnosis not present

## 2024-02-02 DIAGNOSIS — D62 Acute posthemorrhagic anemia: Secondary | ICD-10-CM | POA: Diagnosis not present

## 2024-02-02 DIAGNOSIS — O9903 Anemia complicating the puerperium: Secondary | ICD-10-CM | POA: Diagnosis not present

## 2024-02-02 DIAGNOSIS — O34219 Maternal care for unspecified type scar from previous cesarean delivery: Secondary | ICD-10-CM | POA: Diagnosis not present

## 2024-02-02 DIAGNOSIS — F17210 Nicotine dependence, cigarettes, uncomplicated: Secondary | ICD-10-CM | POA: Diagnosis present

## 2024-02-02 DIAGNOSIS — Z833 Family history of diabetes mellitus: Secondary | ICD-10-CM | POA: Diagnosis not present

## 2024-02-02 DIAGNOSIS — O133 Gestational [pregnancy-induced] hypertension without significant proteinuria, third trimester: Principal | ICD-10-CM | POA: Diagnosis present

## 2024-02-02 DIAGNOSIS — Z3A38 38 weeks gestation of pregnancy: Secondary | ICD-10-CM

## 2024-02-02 DIAGNOSIS — O139 Gestational [pregnancy-induced] hypertension without significant proteinuria, unspecified trimester: Secondary | ICD-10-CM | POA: Diagnosis not present

## 2024-02-02 LAB — COMPREHENSIVE METABOLIC PANEL WITH GFR
ALT: 7 U/L (ref 0–44)
AST: 17 U/L (ref 15–41)
Albumin: 3.2 g/dL — ABNORMAL LOW (ref 3.5–5.0)
Alkaline Phosphatase: 136 U/L — ABNORMAL HIGH (ref 38–126)
Anion gap: 12 (ref 5–15)
BUN: 6 mg/dL (ref 6–20)
CO2: 17 mmol/L — ABNORMAL LOW (ref 22–32)
Calcium: 9.1 mg/dL (ref 8.9–10.3)
Chloride: 107 mmol/L (ref 98–111)
Creatinine, Ser: 0.65 mg/dL (ref 0.44–1.00)
GFR, Estimated: >60 mL/min
Glucose, Bld: 78 mg/dL (ref 70–99)
Potassium: 3.5 mmol/L (ref 3.5–5.1)
Sodium: 136 mmol/L (ref 135–145)
Total Bilirubin: 0.5 mg/dL (ref 0.0–1.2)
Total Protein: 6.5 g/dL (ref 6.5–8.1)

## 2024-02-02 LAB — CBC
HCT: 35.3 % — ABNORMAL LOW (ref 36.0–46.0)
HCT: 35.4 % — ABNORMAL LOW (ref 36.0–46.0)
Hemoglobin: 11.2 g/dL — ABNORMAL LOW (ref 12.0–15.0)
Hemoglobin: 11.5 g/dL — ABNORMAL LOW (ref 12.0–15.0)
MCH: 30.1 pg (ref 26.0–34.0)
MCH: 30.7 pg (ref 26.0–34.0)
MCHC: 31.7 g/dL (ref 30.0–36.0)
MCHC: 32.5 g/dL (ref 30.0–36.0)
MCV: 94.9 fL (ref 80.0–100.0)
MCV: 94.7 fL (ref 80.0–100.0)
Platelets: 209 K/uL (ref 150–400)
Platelets: 224 K/uL (ref 150–400)
RBC: 3.72 MIL/uL — ABNORMAL LOW (ref 3.87–5.11)
RBC: 3.74 MIL/uL — ABNORMAL LOW (ref 3.87–5.11)
RDW: 13.4 % (ref 11.5–15.5)
RDW: 13.3 % (ref 11.5–15.5)
WBC: 7.2 K/uL (ref 4.0–10.5)
WBC: 13.9 K/uL — ABNORMAL HIGH (ref 4.0–10.5)
nRBC: 0 % (ref 0.0–0.2)
nRBC: 0 % (ref 0.0–0.2)

## 2024-02-02 LAB — PROTEIN / CREATININE RATIO, URINE
Creatinine, Urine: 141 mg/dL
Protein Creatinine Ratio: 0.2 mg/mg — ABNORMAL HIGH
Total Protein, Urine: 32 mg/dL

## 2024-02-02 SURGERY — Surgical Case
Anesthesia: Spinal

## 2024-02-02 MED ORDER — KETOROLAC TROMETHAMINE 30 MG/ML IJ SOLN
30.0000 mg | Freq: Four times a day (QID) | INTRAMUSCULAR | Status: DC | PRN
  Administered 2024-02-02: 30 mg via INTRAMUSCULAR

## 2024-02-02 MED ORDER — ONDANSETRON HCL 4 MG/2ML IJ SOLN: 4.0000 mg | Freq: Once | INTRAMUSCULAR | Status: DC | PRN

## 2024-02-02 MED ORDER — DROPERIDOL 2.5 MG/ML IJ SOLN: 0.6250 mg | Freq: Once | INTRAMUSCULAR | Status: DC | PRN

## 2024-02-02 MED ORDER — GABAPENTIN 300 MG PO CAPS
300.0000 mg | ORAL_CAPSULE | Freq: Two times a day (BID) | ORAL | Status: DC
  Administered 2024-02-02 – 2024-02-04 (×5): 300 mg via ORAL
  Filled 2024-02-02 (×5): qty 1

## 2024-02-02 MED ORDER — NALOXONE HCL 0.4 MG/ML IJ SOLN: 0.4000 mg | INTRAMUSCULAR | Status: DC | PRN

## 2024-02-02 MED ORDER — DIBUCAINE (PERIANAL) 1 % EX OINT: 1.0000 | TOPICAL_OINTMENT | CUTANEOUS | Status: DC | PRN

## 2024-02-02 MED ORDER — MORPHINE SULFATE (PF) 0.5 MG/ML IJ SOLN
INTRAMUSCULAR | Status: AC
  Filled 2024-02-02: qty 10

## 2024-02-02 MED ORDER — CEFAZOLIN SODIUM-DEXTROSE 2-4 GM/100ML-% IV SOLN
2.0000 g | INTRAVENOUS | Status: AC
  Administered 2024-02-02: 2 g via INTRAVENOUS

## 2024-02-02 MED ORDER — SOD CITRATE-CITRIC ACID 500-334 MG/5ML PO SOLN
ORAL | Status: AC
  Filled 2024-02-02: qty 30

## 2024-02-02 MED ORDER — FUROSEMIDE 20 MG PO TABS
20.0000 mg | ORAL_TABLET | Freq: Every day | ORAL | Status: DC
  Administered 2024-02-02 – 2024-02-04 (×3): 20 mg via ORAL
  Filled 2024-02-02 (×3): qty 1

## 2024-02-02 MED ORDER — MEPERIDINE HCL 25 MG/ML IJ SOLN: 6.2500 mg | INTRAMUSCULAR | Status: DC | PRN

## 2024-02-02 MED ORDER — MORPHINE SULFATE (PF) 0.5 MG/ML IJ SOLN
INTRAMUSCULAR | Status: DC | PRN
  Administered 2024-02-02: 150 ug via INTRATHECAL

## 2024-02-02 MED ORDER — OXYCODONE HCL 5 MG PO TABS
5.0000 mg | ORAL_TABLET | ORAL | Status: DC | PRN
  Administered 2024-02-03: 5 mg via ORAL
  Administered 2024-02-04 (×2): 10 mg via ORAL
  Filled 2024-02-02 (×2): qty 2
  Filled 2024-02-02: qty 1

## 2024-02-02 MED ORDER — IBUPROFEN 600 MG PO TABS
600.0000 mg | ORAL_TABLET | Freq: Four times a day (QID) | ORAL | Status: DC
  Administered 2024-02-02 – 2024-02-04 (×8): 600 mg via ORAL
  Filled 2024-02-02 (×8): qty 1

## 2024-02-02 MED ORDER — SIMETHICONE 80 MG PO CHEW
80.0000 mg | CHEWABLE_TABLET | Freq: Three times a day (TID) | ORAL | Status: DC
  Administered 2024-02-02 – 2024-02-04 (×6): 80 mg via ORAL
  Filled 2024-02-02 (×6): qty 1

## 2024-02-02 MED ORDER — SODIUM CHLORIDE 0.9 % IR SOLN
Status: DC | PRN
  Administered 2024-02-02: 1000 mL

## 2024-02-02 MED ORDER — OXYTOCIN-SODIUM CHLORIDE 30-0.9 UT/500ML-% IV SOLN
INTRAVENOUS | Status: DC | PRN
  Administered 2024-02-02: 999 mL/h via INTRAVENOUS

## 2024-02-02 MED ORDER — LACTATED RINGERS IV SOLN: INTRAVENOUS | Status: DC | PRN

## 2024-02-02 MED ORDER — COCONUT OIL OIL: 1.0000 | TOPICAL_OIL | Status: DC | PRN

## 2024-02-02 MED ORDER — TRANEXAMIC ACID-NACL 1000-0.7 MG/100ML-% IV SOLN
1000.0000 mg | Freq: Once | INTRAVENOUS | Status: AC
  Administered 2024-02-02: 1000 mg via INTRAVENOUS

## 2024-02-02 MED ORDER — KETOROLAC TROMETHAMINE 30 MG/ML IJ SOLN
INTRAMUSCULAR | Status: AC
  Filled 2024-02-02: qty 1

## 2024-02-02 MED ORDER — ONDANSETRON HCL 4 MG/2ML IJ SOLN
INTRAMUSCULAR | Status: AC
  Filled 2024-02-02: qty 2

## 2024-02-02 MED ORDER — ONDANSETRON HCL 4 MG/2ML IJ SOLN: 4.0000 mg | Freq: Three times a day (TID) | INTRAMUSCULAR | Status: DC | PRN

## 2024-02-02 MED ORDER — OXYTOCIN-SODIUM CHLORIDE 30-0.9 UT/500ML-% IV SOLN: 2.5000 [IU]/h | INTRAVENOUS | Status: DC

## 2024-02-02 MED ORDER — SCOPOLAMINE 1 MG/3DAYS TD PT72: 1.0000 | MEDICATED_PATCH | TRANSDERMAL | Status: DC

## 2024-02-02 MED ORDER — DIPHENHYDRAMINE HCL 25 MG PO CAPS: 25.0000 mg | ORAL_CAPSULE | Freq: Four times a day (QID) | ORAL | Status: DC | PRN

## 2024-02-02 MED ORDER — FENTANYL CITRATE (PF) 100 MCG/2ML IJ SOLN: 25.0000 ug | INTRAMUSCULAR | Status: DC | PRN

## 2024-02-02 MED ORDER — ENOXAPARIN SODIUM 40 MG/0.4ML IJ SOSY
40.0000 mg | PREFILLED_SYRINGE | INTRAMUSCULAR | Status: DC
  Administered 2024-02-03 – 2024-02-04 (×2): 40 mg via SUBCUTANEOUS
  Filled 2024-02-02 (×2): qty 0.4

## 2024-02-02 MED ORDER — PHENYLEPHRINE HCL-NACL 20-0.9 MG/250ML-% IV SOLN
INTRAVENOUS | Status: DC | PRN
  Administered 2024-02-02: 60 ug/min via INTRAVENOUS

## 2024-02-02 MED ORDER — SCOPOLAMINE 1 MG/3DAYS TD PT72
MEDICATED_PATCH | TRANSDERMAL | Status: AC
  Filled 2024-02-02: qty 1

## 2024-02-02 MED ORDER — POLYETHYLENE GLYCOL 3350 17 G PO PACK
17.0000 g | PACK | ORAL | Status: DC
  Administered 2024-02-03: 17 g via ORAL
  Filled 2024-02-02: qty 1

## 2024-02-02 MED ORDER — FENTANYL CITRATE (PF) 100 MCG/2ML IJ SOLN
INTRAMUSCULAR | Status: AC
  Filled 2024-02-02: qty 2

## 2024-02-02 MED ORDER — ONDANSETRON HCL 4 MG/2ML IJ SOLN
INTRAMUSCULAR | Status: DC | PRN
  Administered 2024-02-02: 4 mg via INTRAVENOUS

## 2024-02-02 MED ORDER — POTASSIUM CHLORIDE CRYS ER 20 MEQ PO TBCR
20.0000 meq | EXTENDED_RELEASE_TABLET | Freq: Every day | ORAL | Status: DC
  Administered 2024-02-02 – 2024-02-04 (×3): 20 meq via ORAL
  Filled 2024-02-02 (×3): qty 1

## 2024-02-02 MED ORDER — ACETAMINOPHEN 10 MG/ML IV SOLN
INTRAVENOUS | Status: DC | PRN
  Administered 2024-02-02: 1000 mg via INTRAVENOUS

## 2024-02-02 MED ORDER — PHENYLEPHRINE HCL-NACL 20-0.9 MG/250ML-% IV SOLN
INTRAVENOUS | Status: AC
  Filled 2024-02-02: qty 250

## 2024-02-02 MED ORDER — DIPHENHYDRAMINE HCL 25 MG PO CAPS: 25.0000 mg | ORAL_CAPSULE | ORAL | Status: DC | PRN

## 2024-02-02 MED ORDER — NALOXONE HCL 4 MG/10ML IJ SOLN: 1.0000 ug/kg/h | INTRAVENOUS | Status: DC | PRN

## 2024-02-02 MED ORDER — PRENATAL MULTIVITAMIN CH
1.0000 | ORAL_TABLET | Freq: Every day | ORAL | Status: DC
  Administered 2024-02-03 – 2024-02-04 (×2): 1 via ORAL
  Filled 2024-02-02 (×2): qty 1

## 2024-02-02 MED ORDER — CEFAZOLIN SODIUM-DEXTROSE 2-4 GM/100ML-% IV SOLN
INTRAVENOUS | Status: AC
  Filled 2024-02-02: qty 100

## 2024-02-02 MED ORDER — DEXAMETHASONE SOD PHOSPHATE PF 10 MG/ML IJ SOLN
INTRAMUSCULAR | Status: DC | PRN
  Administered 2024-02-02 (×2): 5 mg via INTRAVENOUS

## 2024-02-02 MED ORDER — SENNOSIDES-DOCUSATE SODIUM 8.6-50 MG PO TABS: 2.0000 | ORAL_TABLET | Freq: Every evening | ORAL | Status: DC | PRN

## 2024-02-02 MED ORDER — DIPHENHYDRAMINE HCL 50 MG/ML IJ SOLN: 12.5000 mg | INTRAMUSCULAR | Status: DC | PRN

## 2024-02-02 MED ORDER — OXYTOCIN-SODIUM CHLORIDE 30-0.9 UT/500ML-% IV SOLN
INTRAVENOUS | Status: AC
  Filled 2024-02-02: qty 500

## 2024-02-02 MED ORDER — SIMETHICONE 80 MG PO CHEW: 80.0000 mg | CHEWABLE_TABLET | ORAL | Status: DC | PRN

## 2024-02-02 MED ORDER — SODIUM CHLORIDE 0.9% FLUSH: 3.0000 mL | INTRAVENOUS | Status: DC | PRN

## 2024-02-02 MED ORDER — LIDOCAINE-EPINEPHRINE (PF) 2 %-1:200000 IJ SOLN
INTRAMUSCULAR | Status: AC
  Filled 2024-02-02: qty 20

## 2024-02-02 MED ORDER — SOD CITRATE-CITRIC ACID 500-334 MG/5ML PO SOLN
30.0000 mL | ORAL | Status: AC
  Administered 2024-02-02: 30 mL via ORAL

## 2024-02-02 MED ORDER — STERILE WATER FOR IRRIGATION IR SOLN
Status: DC | PRN
  Administered 2024-02-02: 1

## 2024-02-02 MED ORDER — KETOROLAC TROMETHAMINE 30 MG/ML IJ SOLN: 30.0000 mg | Freq: Four times a day (QID) | INTRAMUSCULAR | Status: DC | PRN

## 2024-02-02 MED ORDER — ACETAMINOPHEN 10 MG/ML IV SOLN
INTRAVENOUS | Status: AC
  Filled 2024-02-02: qty 100

## 2024-02-02 MED ORDER — TRANEXAMIC ACID-NACL 1000-0.7 MG/100ML-% IV SOLN
INTRAVENOUS | Status: AC
  Filled 2024-02-02: qty 100

## 2024-02-02 MED ORDER — FENTANYL CITRATE (PF) 100 MCG/2ML IJ SOLN
INTRAMUSCULAR | Status: DC | PRN
  Administered 2024-02-02: 15 ug via INTRATHECAL

## 2024-02-02 MED ORDER — MENTHOL 3 MG MT LOZG: 1.0000 | LOZENGE | OROMUCOSAL | Status: DC | PRN

## 2024-02-02 MED ORDER — SODIUM CHLORIDE 0.9 % IV SOLN: 500.0000 mg | INTRAVENOUS | Status: DC

## 2024-02-02 MED ORDER — BUPIVACAINE IN DEXTROSE 0.75-8.25 % IT SOLN
INTRATHECAL | Status: DC | PRN
  Administered 2024-02-02: 1.8 mL via INTRATHECAL

## 2024-02-02 MED ORDER — WITCH HAZEL-GLYCERIN EX PADS: 1.0000 | MEDICATED_PAD | CUTANEOUS | Status: DC | PRN

## 2024-02-02 MED ORDER — SCOPOLAMINE 1 MG/3DAYS TD PT72
1.0000 | MEDICATED_PATCH | TRANSDERMAL | Status: DC
  Administered 2024-02-02: 1 mg via TRANSDERMAL

## 2024-02-02 SURGICAL SUPPLY — 24 items
BENZOIN TINCTURE PRP APPL 2/3 (GAUZE/BANDAGES/DRESSINGS) IMPLANT
CHLORAPREP W/TINT 26 (MISCELLANEOUS) IMPLANT
DERMABOND ADVANCED .7 DNX12 (GAUZE/BANDAGES/DRESSINGS) IMPLANT
DRSG OPSITE POSTOP 4X10 (GAUZE/BANDAGES/DRESSINGS) IMPLANT
ELECTRODE REM PT RTRN 9FT ADLT (ELECTROSURGICAL) IMPLANT
GAUZE SPONGE 4X4 12PLY STRL (GAUZE/BANDAGES/DRESSINGS) IMPLANT
GLOVE BIOGEL PI IND STRL 7.0 (GLOVE) IMPLANT
GLOVE BIOGEL PI IND STRL 7.5 (GLOVE) IMPLANT
GLOVE SURG SS PI 7.0 STRL IVOR (GLOVE) IMPLANT
GOWN STRL REUS W/ TWL LRG LVL3 (GOWN DISPOSABLE) IMPLANT
GOWN STRL REUS W/ TWL XL LVL3 (GOWN DISPOSABLE) IMPLANT
MAT PREVALON FULL STRYKER (MISCELLANEOUS) IMPLANT
PACK C SECTION WH (CUSTOM PROCEDURE TRAY) IMPLANT
PAD ABD 8X10 STRL (GAUZE/BANDAGES/DRESSINGS) IMPLANT
PAD PREP 24X48 CUFFED NSTRL (MISCELLANEOUS) IMPLANT
RETRACTOR TRAXI PANNICULUS (MISCELLANEOUS) IMPLANT
RETRACTOR WND ALEXIS 25 LRG (MISCELLANEOUS) IMPLANT
STRIP CLOSURE SKIN 1/2X4 (GAUZE/BANDAGES/DRESSINGS) IMPLANT
SUT MNCRL 0 VIOLET CTX 36 (SUTURE) IMPLANT
SUT MON AB 4-0 PS1 27 (SUTURE) IMPLANT
SUT VIC AB 0 CT1 36 (SUTURE) IMPLANT
SUT VICRYL+ 3-0 36IN CT-1 (SUTURE) IMPLANT
SUTURE PLAIN GUT 2.0 ETHICON (SUTURE) IMPLANT
TOWEL OR 17X24 6PK STRL BLUE (TOWEL DISPOSABLE) IMPLANT

## 2024-02-02 NOTE — Brief Op Note (Signed)
 02/02/2024  10:38 AM  PATIENT:  Lisa Byrd  33 y.o. female  PRE-OPERATIVE DIAGNOSIS:  RCSx3  POST-OPERATIVE DIAGNOSIS:  RCSx3  PROCEDURE:  Rpt c-section  SURGEON:  Surgeons and Role:    * Izell Harari, MD - Primary  PHYSICIAN ASSISTANT:   ASSISTANTS: Cashion MD OB Fellow   ANESTHESIA:   spinal  EBL:    BLOOD ADMINISTERED:none  DRAINS: UOP clear via foley  IVF: 2L crystalloid   LOCAL MEDICATIONS USED:  NONE  SPECIMEN:  No Specimen  DISPOSITION OF SPECIMEN:  N/A  COUNTS:  YES  TOURNIQUET:  * No tourniquets in log *  DICTATION: .Note written in EPIC  PLAN OF CARE: Admit to inpatient   PATIENT DISPOSITION:  PACU - hemodynamically stable.   Delay start of Pharmacological VTE agent (>24hrs) due to surgical blood loss or risk of bleeding: yes  Harari Izell Raddle MD Attending [facility] [employer] (Faculty Practice) 02/02/2024 Time: 1039am

## 2024-02-02 NOTE — Transfer of Care (Signed)
 Immediate Anesthesia Transfer of Care Note  Patient: Lisa Byrd  Procedure(s) Performed: CESAREAN SECTION, WITH BILATERAL TUBAL LIGATION  Patient Location: PACU  Anesthesia Type:Spinal  Level of Consciousness: awake  Airway & Oxygen Therapy: Patient Spontanous Breathing  Post-op Assessment: Report given to RN and Post -op Vital signs reviewed and stable  Post vital signs: Reviewed and stable  Last Vitals:  Vitals Value Taken Time  BP    Temp    Pulse    Resp    SpO2      Last Pain:  Vitals:   02/02/24 0721  TempSrc: Oral         Complications: No notable events documented.

## 2024-02-02 NOTE — Discharge Summary (Signed)
 "    Postpartum Discharge Summary    Patient Name: Lisa Byrd DOB: 1990/03/06 MRN: [id number]  Date of admission: 02/02/2024 Delivery date:02/02/2024 Delivering provider: IZELL HARARI Date of discharge: 02/04/2024  Admitting diagnosis: Pregnancy at 39/1. History of c-section x 2. Scheduled repeat c-section. New GHTN  Additional problems: None    Discharge diagnosis: Term Pregnancy Delivered and Gestational Hypertension                                              Post partum procedures:none Augmentation: N/A Complications: None  Hospital course: Patient had mild range BPs in the pre op area. Rpt CBC and CMP negative. She did not want a BTL. She had an uncomplicated RLTCS. Details of operation can be found in separate operative note.  Patient had a postpartum course complicated by nothing.  She was started on lasix  and kdur on POD#0 and her BPs during her hospitalization were mildly elevated. She is ambulating, tolerating a regular diet, passing flatus, and urinating well. Patient is discharged home in stable condition on  02/04/2024        Newborn Data: Birth date:02/02/2024 Birth time:9:50 AM Gender:Female Living status:Living Apgars:9 ,9  Weight:3530 g    Magnesium Sulfate received: No BMZ received: No Rhophylac:N/A Transfusion:No Immunizations administered: Immunization History  Administered Date(s) Administered    sv, Bivalent, Protein Subunit Rsvpref,pf [place]) 01/23/2024   Influenza, Seasonal, Injecte, Preservative Fre 12/06/2023   PPD Test 06/22/2014   Tdap 07/14/2012, 03/22/2016, 12/06/2023    Physical exam  Vitals:   02/03/24 1436 02/03/24 2144 02/03/24 2300 02/04/24 0524  BP: 130/80 (!) 140/96 137/68 136/79  Pulse: 77 84 88 72  Resp: 16 16  18   Temp: 98.6 F (37 C) 99.1 F (37.3 C)    TempSrc: Oral Oral  Oral  SpO2: 100%   99%  Weight:      Height:       General: alert, cooperative, and no distress Lochia: appropriate Uterine Fundus:  firm Incision: Healing well with no significant drainage, No significant erythema, Dressing is clean, dry, and intact DVT Evaluation: No evidence of DVT seen on physical exam. Labs: Lab Results  Component Value Date   WBC 14.6 (H) 02/03/2024   HGB 9.3 (L) 02/03/2024   HCT 29.0 (L) 02/03/2024   MCV 93.2 02/03/2024   PLT 235 02/03/2024      Latest Ref Rng & Units 02/02/2024    8:45 AM  CMP  Glucose 70 - 99 mg/dL 78   BUN 6 - 20 mg/dL 6   Creatinine 9.55 - 8.99 mg/dL 9.34   Sodium 864 - 854 mmol/L 136   Potassium 3.5 - 5.1 mmol/L 3.5   Chloride 98 - 111 mmol/L 107   CO2 22 - 32 mmol/L 17   Calcium 8.9 - 10.3 mg/dL 9.1   Total Protein 6.5 - 8.1 g/dL 6.5   Total Bilirubin 0.0 - 1.2 mg/dL 0.5   Alkaline Phos 38 - 126 U/L 136   AST 15 - 41 U/L 17   ALT 0 - 44 U/L 7    Edinburgh Score:    02/04/2024    8:30 AM  Edinburgh Postnatal Depression Scale Screening Tool  I have been able to laugh and see the funny side of things. 0  I have looked forward with enjoyment to things. 0  I have blamed myself unnecessarily  when things went wrong. 2  I have been anxious or worried for no good reason. 2  I have felt scared or panicky for no good reason. 0  Things have been getting on top of me. 2  I have been so unhappy that I have had difficulty sleeping. 2  I have felt sad or miserable. 1  I have been so unhappy that I have been crying. 1  The thought of harming myself has occurred to me. 0  Edinburgh Postnatal Depression Scale Total 10      After visit meds:  Allergies as of 02/04/2024   No Known Allergies      Medication List     TAKE these medications    acetaminophen  500 MG tablet Commonly known as: TYLENOL  Take 2 tablets (1,000 mg total) by mouth every 6 (six) hours as needed for moderate pain (pain score 4-6) or mild pain (pain score 1-3).   ferrous sulfate  325 (65 FE) MG tablet Take 1 tablet (325 mg total) by mouth every other day.   furosemide  20 MG  tablet Commonly known as: LASIX  Take 1 tablet (20 mg total) by mouth daily.   gabapentin  300 MG capsule Commonly known as: NEURONTIN  Take 1 capsule (300 mg total) by mouth 3 (three) times daily.   ibuprofen  600 MG tablet Commonly known as: ADVIL  Take 1 tablet (600 mg total) by mouth every 6 (six) hours as needed.   NIFEdipine  30 MG 24 hr tablet Commonly known as: ADALAT  CC Take 1 tablet (30 mg total) by mouth daily.   oxyCODONE  5 MG immediate release tablet Commonly known as: Oxy IR/ROXICODONE  Take 1 tablet (5 mg total) by mouth every 4 (four) hours as needed for moderate pain (pain score 4-6).   polyethylene glycol 17 g packet Commonly known as: MIRALAX  / GLYCOLAX  Take 17 g by mouth daily as needed.   potassium chloride  SA 20 MEQ tablet Commonly known as: KLOR-CON  M Take 1 tablet (20 mEq total) by mouth daily.   Prenatal 27-1 MG Tabs Take 1 tablet by mouth daily.         Discharge home in stable condition Infant Feeding: Bottle and Breast Infant Disposition:home with mother Discharge instruction: per After Visit Summary and Postpartum booklet. Activity: Advance as tolerated. Pelvic rest for 6 weeks.  Diet: iron rich diet Anticipated Birth Control: OP Nexplanon  Postpartum Appointment:6 weeks Additional Postpartum F/U: Incision check and BP check 1 week Future Appointments: Future Appointments  Date Time Provider Department Center  02/12/2024  3:40 PM [facility] NURSE [facility]   Follow up Visit:  [X]  message sent on 12/21 for a 1wk BP and incision check     02/04/2024 Conroy Goracke  Claudene, CNM   "

## 2024-02-02 NOTE — Anesthesia Postprocedure Evaluation (Signed)
"   Anesthesia Post Note  Patient: Lisa Byrd  Procedure(s) Performed: CESAREAN SECTION, WITH BILATERAL TUBAL LIGATION     Patient location during evaluation: PACU Anesthesia Type: Spinal Level of consciousness: oriented and awake and alert Pain management: pain level controlled Vital Signs Assessment: post-procedure vital signs reviewed and stable Respiratory status: spontaneous breathing, respiratory function stable and nonlabored ventilation Cardiovascular status: blood pressure returned to baseline and stable Postop Assessment: no headache, no backache, no apparent nausea or vomiting, spinal receding and patient able to bend at knees Anesthetic complications: no   No notable events documented.  Last Vitals:  Vitals:   02/02/24 1100 02/02/24 1115  BP: (!) 138/98 107/66  Pulse:  69  Resp: 18 16  Temp: (!) 35.7 C   SpO2:  98%    Last Pain:  Vitals:   02/02/24 1120  TempSrc:   PainSc: 0-No pain   Pain Goal:    LLE Motor Response: Purposeful movement (02/02/24 1120) LLE Sensation: Tingling (02/02/24 1120) RLE Motor Response: Purposeful movement (02/02/24 1120) RLE Sensation: Numbness (02/02/24 1120)     Epidural/Spinal Function Cutaneous sensation: Vague (02/02/24 1120), Patient able to flex knees: Yes (02/02/24 1120), Patient able to lift hips off bed: Yes (02/02/24 1120), Back pain beyond tenderness at insertion site: No (02/02/24 1120), Progressively worsening motor and/or sensory loss: No (02/02/24 1120), Bowel and/or bladder incontinence post epidural: No (02/02/24 1120)  Dezi Schaner A.      "

## 2024-02-02 NOTE — Anesthesia Procedure Notes (Addendum)
 Spinal  Patient location during procedure: OR Start time: 02/02/2024 9:19 AM End time: 02/02/2024 9:22 AM Reason for block: surgical anesthesia  Staffing Performed: anesthesiologist  Authorized by: Jerrye Sharper, MD   Performed by: Jerrye Sharper, MD  Preanesthetic Checklist Completed: patient identified, IV checked, site marked, risks and benefits discussed, surgical consent, monitors and equipment checked, pre-op evaluation and timeout performed Spinal Block Patient position: sitting Prep: DuraPrep and site prepped and draped Patient monitoring: heart rate, cardiac monitor, continuous pulse ox and blood pressure Approach: midline Location: L3-4 Injection technique: single-shot Needle Needle type: Pencan  Needle gauge: 24 G Needle length: 9 cm Needle insertion depth (cm): 8 Assessment Sensory level: T4 Events: CSF return  Additional Notes Patient tolerated procedure well. Adequate sensory level.

## 2024-02-02 NOTE — Progress Notes (Signed)
 OB Note I received a call from the nurse that patient does not want a BTL anymore.  I went and spoke to the patient again and she confirms this and maybe a nexplanon  instead.   Consent form re-signed for just a repeat c-section  Bebe Izell Raddle MD Attending [facility] [employer] (Faculty Practice) 02/02/2024 Time: 0900

## 2024-02-02 NOTE — Discharge Instructions (Signed)
Continue to check your blood pressures twice a day Call the office for blood pressures that are consistently above 140 for the top number or 90 for the bottom number   Hypertension During Pregnancy Hypertension is also called high blood pressure. High blood pressure means that the force of your blood moving in your body is too strong. It can cause problems for you and your baby. Different types of high blood pressure can happen during pregnancy. The types are: High blood pressure before you got pregnant. This is called chronic hypertension.  This can continue during your pregnancy. Your doctor will want to keep checking your blood pressure. You may need medicine to keep your blood pressure under control while you are pregnant. You will need follow-up visits after you have your baby. High blood pressure that goes up during pregnancy when it was normal before. This is called gestational hypertension. It will usually get better after you have your baby, but your doctor will need to watch your blood pressure to make sure that it is getting better. Very high blood pressure during pregnancy. This is called preeclampsia. Very high blood pressure is an emergency that needs to be checked and treated right away. You may develop very high blood pressure after giving birth. This is called postpartum preeclampsia. This usually occurs within 48 hours after childbirth but may occur up to 6 weeks after giving birth. This is rare. How does this affect me? If you have high blood pressure during pregnancy, you have a higher chance of developing high blood pressure: As you get older. If you get pregnant again. In some cases, high blood pressure during pregnancy can cause: Stroke. Heart attack. Damage to the kidneys, lungs, or liver. Preeclampsia. Jerky movements you cannot control (convulsions or seizures). Problems with the placenta.  What can I do to lower my risk?  Keep a healthy weight. Eat a healthy  diet. Follow what your doctor tells you about treating any medical problems that you had before becoming pregnant. It is very important to go to all of your doctor visits. Your doctor will check your blood pressure and make sure that your pregnancy is progressing as it should. Treatment should start early if a problem is found.  Follow these instructions at home:  Take your blood pressure 1-2 times per day. Call the office if your blood pressure is 155 or higher for the top number or 105 or higher for the bottom number.    Eating and drinking  Drink enough fluid to keep your pee (urine) pale yellow. Avoid caffeine. Lifestyle Do not use any products that contain nicotine or tobacco, such as cigarettes, e-cigarettes, and chewing tobacco. If you need help quitting, ask your doctor. Do not use alcohol or drugs. Avoid stress. Rest and get plenty of sleep. Regular exercise can help. Ask your doctor what kinds of exercise are best for you. General instructions Take over-the-counter and prescription medicines only as told by your doctor. Keep all prenatal and follow-up visits as told by your doctor. This is important. Contact a doctor if: You have symptoms that your doctor told you to watch for, such as: Headaches. Nausea. Vomiting. Belly (abdominal) pain. Dizziness. Light-headedness. Get help right away if: You have: Very bad belly pain that does not get better with treatment. A very bad headache that does not get better. Vomiting that does not get better. Sudden, fast weight gain. Sudden swelling in your hands, ankles, or face. Blood in your pee. Blurry vision. Double vision.  Shortness of breath. Chest pain. Weakness on one side of your body. Trouble talking. Summary High blood pressure is also called hypertension. High blood pressure means that the force of your blood moving in your body is too strong. High blood pressure can cause problems for you and your baby. Keep all  follow-up visits as told by your doctor. This is important. This information is not intended to replace advice given to you by your health care provider. Make sure you discuss any questions you have with your health care provider. Document Released: 03/03/2010 Document Revised: 05/22/2018 Document Reviewed: 02/25/2018 Elsevier Patient Education  2020 [employer].

## 2024-02-02 NOTE — Op Note (Signed)
 Operative Note   SURGERY DATE: 02/02/2024  PRE-OP DIAGNOSIS:  *Pregnancy at 39/1 *Scheduled repeat cesarean section *GHTN on admission to pre-operative area  POST-OP DIAGNOSIS: Same. Delivered   PROCEDURE: Repeat low transverse cesarean section via pfannenstiel skin incision with double layer uterine closure  SURGEON: Surgeons and Role:    * Izell Harari, MD - Primary  ASSISTANT: Barkley Angles, MD (OB Fellow)  An experienced assistant was required given the standard of surgical care given the complexity of the case.  This assistant was needed for exposure, dissection, suctioning, retraction, instrument exchange, assisting with delivery with administration of fundal pressure, and for overall help during the procedure.  ANESTHESIA: spinal  ESTIMATED BLOOD LOSS:  DRAINS: UOP via indwelling foley  TOTAL IV FLUIDS: crystalloid  VTE PROPHYLAXIS: SCDs to bilateral lower extremities  ANTIBIOTICS: Two grams of Cefazolin  were given., within 1 hour of skin incision  SPECIMENS: none  COMPLICATIONS: none  FINDINGS: Of note, there were no intra-abdominal adhesions were noted. Grossly normal uterus, tubes and ovaries. clear amniotic fluid, cephalic,  female infant, weight 3530gm, APGARs 9/9, intact placenta.  PROCEDURE IN DETAIL: The patient was taken to the operating room where anesthesia was administered and normal fetal heart tones were confirmed. She was then prepped and draped in the normal fashion in the dorsal supine position with a leftward tilt.  After a time out was performed, a pfannensteil skin incision was made with the scalpel and carried through to the underlying layer of fascia. The fascia was then incised at the midline and this incision was extended laterally with the mayo scissors. Attention was turned to the superior aspect of the fascial incision which was grasped with the kocher clamps x 2, tented up and the rectus muscles were dissected off with  the scalpel. In a similar fashion the inferior aspect of the fascial incision was grasped with the kocher clamps, tented up and the rectus muscles dissected off with the mayo scissors. The rectus muscles were then separated in the midline and the peritoneum was entered bluntly. The bladder blade was inserted and the vesicouterine peritoneum was identified, tented up and entered with the metzenbaum scissors. This incision was extended laterally and the bladder flap was created digitally. The bladder blade was reinserted.  A low transverse hysterotomy was made with the scalpel until the endometrial cavity was breached and the amniotic sac ruptured with the Allis clamp, yielding clear amniotic fluid. This incision was extended bluntly and the infants head, shoulders and body were delivered atraumatically.The cord was clamped x 2 and cut, and the infant was handed to the awaiting pediatricians, after delayed cord clamping was done.  The placenta was then gradually expressed from the uterus and then the uterus was exteriorized and cleared of all clots and debris. The hysterotomy was repaired with a running suture of 1-0 monocryl. A second imbricating layer of 1-0 monocryl suture was then placed to achieve excellent hemostasis.   The uterus and adnexa were then returned to the abdomen, and the hysterotomy and all operative sites were reinspected and excellent hemostasis was noted after irrigation and suction of the abdomen with warm saline.  The peritoneum was closed with a running stitch of 3-0 Vicryl. The fascia was reapproximated with 0 Vicryl in a simple running fashion bilaterally. The subcutaneous layer was then reapproximated with interrupted sutures of 2-0 plain gut, and the skin was then closed with 4-0 monocryl, in a subcuticular fashion.  The patient  tolerated the procedure well. Sponge,  lap, needle, and instrument counts were correct x 2. The patient was transferred to the recovery room awake, alert  and breathing independently in stable condition.  Bebe Izell Overcast MD Attending Center for [facility])
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
## 2024-02-03 LAB — CBC
HCT: 29 % — ABNORMAL LOW (ref 36.0–46.0)
Hemoglobin: 9.3 g/dL — ABNORMAL LOW (ref 12.0–15.0)
MCH: 29.9 pg (ref 26.0–34.0)
MCHC: 32.1 g/dL (ref 30.0–36.0)
MCV: 93.2 fL (ref 80.0–100.0)
Platelets: 235 K/uL (ref 150–400)
RBC: 3.11 MIL/uL — ABNORMAL LOW (ref 3.87–5.11)
RDW: 13.2 % (ref 11.5–15.5)
WBC: 14.6 K/uL — ABNORMAL HIGH (ref 4.0–10.5)
nRBC: 0 % (ref 0.0–0.2)

## 2024-02-03 MED ORDER — FERROUS SULFATE 325 (65 FE) MG PO TABS
325.0000 mg | ORAL_TABLET | ORAL | Status: DC
  Administered 2024-02-03: 325 mg via ORAL
  Filled 2024-02-03: qty 1

## 2024-02-03 NOTE — Progress Notes (Signed)
 POSTPARTUM PROGRESS NOTE  POD #1  Subjective:  Lisa Byrd is a 33 y.o. G3P3003 s/p rLTCS at [redacted]w[redacted]d.  She reports she doing well. No acute events overnight. She denies any problems with ambulating, voiding or po intake. Denies nausea or vomiting. She has  passed flatus. Pain is well controlled.  Lochia is minimal.  Objective: Blood pressure 130/80, pulse 77, temperature 98.6 F (37 C), temperature source Oral, resp. rate 16, height 5' 7 (1.702 m), weight 108.7 kg, SpO2 100%, unknown if currently breastfeeding.  Physical Exam:  General: alert, cooperative and no distress Chest: no respiratory distress Heart:regular rate, distal pulses intact Abdomen: soft, nontender,  Uterine Fundus: firm, appropriately tender DVT Evaluation: No calf swelling or tenderness Extremities: Trace edema Skin: warm, dry; incision clean/dry/intact w/ honeycomb dressing in place  Recent Labs    02/02/24 1358 02/03/24 0600  HGB 11.5* 9.3*  HCT 35.4* 29.0*    Assessment/Plan: Lisa Byrd is a 33 y.o. G3P3003 s/p rLTCS at [redacted]w[redacted]d for elective repeat.  POD#1 - Doing welll; pain well controlled. H/H appropriate  Routine postpartum care  OOB, ambulated  Lovenox  for VTE prophylaxis Anemia: asymptomatic; Start po ferrous sulfate  Contraception: Nexplanon  Feeding: breast and formula   Dispo: Plan for discharge in the next 24 to 48 hours. .   LOS: 1 day   Barkley Angles, MD OB Fellow, Faculty Practice [facility], Center for [employer]

## 2024-02-03 NOTE — Patient Instructions (Signed)

## 2024-02-04 ENCOUNTER — Encounter: Payer: Self-pay | Admitting: Student

## 2024-02-04 ENCOUNTER — Encounter (HOSPITAL_COMMUNITY): Payer: Self-pay | Admitting: Obstetrics and Gynecology

## 2024-02-04 ENCOUNTER — Other Ambulatory Visit: Payer: Self-pay | Admitting: Advanced Practice Midwife

## 2024-02-04 ENCOUNTER — Encounter: Admitting: Family Medicine

## 2024-02-04 DIAGNOSIS — O139 Gestational [pregnancy-induced] hypertension without significant proteinuria, unspecified trimester: Secondary | ICD-10-CM | POA: Diagnosis not present

## 2024-02-04 MED ORDER — GABAPENTIN 300 MG PO CAPS: 300.0000 mg | ORAL_CAPSULE | Freq: Three times a day (TID) | ORAL | 0 refills | Status: AC

## 2024-02-04 MED ORDER — OXYCODONE HCL 5 MG PO TABS: 5.0000 mg | ORAL_TABLET | ORAL | 0 refills | Status: AC | PRN

## 2024-02-04 MED ORDER — NIFEDIPINE ER 30 MG PO TB24: 30.0000 mg | ORAL_TABLET | Freq: Every day | ORAL | 0 refills | Status: AC

## 2024-02-04 MED ORDER — FERROUS SULFATE 325 (65 FE) MG PO TABS: 325.0000 mg | ORAL_TABLET | ORAL | 0 refills | Status: AC

## 2024-02-04 MED ORDER — IBUPROFEN 600 MG PO TABS: 600.0000 mg | ORAL_TABLET | Freq: Four times a day (QID) | ORAL | 1 refills | Status: AC | PRN

## 2024-02-04 MED ORDER — POLYETHYLENE GLYCOL 3350 17 G PO PACK: 17.0000 g | PACK | Freq: Every day | ORAL | 0 refills | Status: AC | PRN

## 2024-02-04 MED ORDER — FUROSEMIDE 20 MG PO TABS: 20.0000 mg | ORAL_TABLET | Freq: Every day | ORAL | 0 refills | Status: AC

## 2024-02-04 MED ORDER — ACETAMINOPHEN 500 MG PO TABS: 1000.0000 mg | ORAL_TABLET | Freq: Four times a day (QID) | ORAL | 0 refills | Status: AC | PRN

## 2024-02-04 MED ORDER — POTASSIUM CHLORIDE CRYS ER 20 MEQ PO TBCR: 20.0000 meq | EXTENDED_RELEASE_TABLET | Freq: Every day | ORAL | 0 refills | Status: AC

## 2024-02-04 MED ORDER — NIFEDIPINE ER OSMOTIC RELEASE 30 MG PO TB24
30.0000 mg | ORAL_TABLET | Freq: Every day | ORAL | Status: DC
  Administered 2024-02-04: 30 mg via ORAL
  Filled 2024-02-04: qty 1

## 2024-02-04 NOTE — Discharge Summary (Incomplete)
 "    Postpartum Discharge Summary  Date of Service updated***     Patient Name: Lisa Byrd DOB: 1990-10-27 MRN: [id number]  Date of admission: 02/02/2024 Delivery date:02/02/2024 Delivering provider: IZELL HARARI Date of discharge: 02/04/2024  Admitting diagnosis: Pregnancy at 39/1. History of c-section x 2. Scheduled repeat c-section. New ?GHTN  Additional problems: None    Discharge diagnosis: {DX.:23714}                                              Post partum procedures:{Postpartum procedures:23558} Augmentation: N/A Complications: None  Hospital course: Patient had mild range BPs in the pre op area. Rpt CBC and CMP negative. She did not want a BTL. She had an uncomplicated RLTCS. Details of operation can be found in separate operative note.  Patient had a postpartum course complicated by***.  She was started on lasix  and kdur on POD#0 and her BPs during her hospitalization were ***. She is ambulating, tolerating a regular diet, passing flatus, and urinating well. Patient is discharged home in stable condition on  02/04/2024        Newborn Data: Birth date:02/02/2024 Birth time:9:50 AM Gender:Female Living status:Living Apgars:9 ,9  Weight:3530 g    Magnesium Sulfate received: {Mag received:30440022} BMZ received: No Rhophylac:N/A Transfusion:{Transfusion received:30440034} Immunizations administered: Immunization History  Administered Date(s) Administered    sv, Bivalent, Protein Subunit Rsvpref,pf Marlow) 01/23/2024   Influenza, Seasonal, Injecte, Preservative Fre 12/06/2023   PPD Test 06/22/2014   Tdap 07/14/2012, 03/22/2016, 12/06/2023    Physical exam  Vitals:   02/03/24 1436 02/03/24 2144 02/03/24 2300 02/04/24 0524  BP: 130/80 (!) 140/96 137/68 136/79  Pulse: 77 84 88 72  Resp: 16 16  18   Temp: 98.6 F (37 C) 99.1 F (37.3 C)    TempSrc: Oral Oral  Oral  SpO2: 100%   99%  Weight:      Height:       General: {Exam;  general:21111117} Lochia: {Desc; appropriate/inappropriate:30686::appropriate} Uterine Fundus: {Desc; firm/soft:30687} Incision: {Exam; incision:21111123} DVT Evaluation: {Exam; dvt:2111122} Labs: Lab Results  Component Value Date   WBC 14.6 (H) 02/03/2024   HGB 9.3 (L) 02/03/2024   HCT 29.0 (L) 02/03/2024   MCV 93.2 02/03/2024   PLT 235 02/03/2024      Latest Ref Rng & Units 02/02/2024    8:45 AM  CMP  Glucose 70 - 99 mg/dL 78   BUN 6 - 20 mg/dL 6   Creatinine 9.55 - 8.99 mg/dL 9.34   Sodium 864 - 854 mmol/L 136   Potassium 3.5 - 5.1 mmol/L 3.5   Chloride 98 - 111 mmol/L 107   CO2 22 - 32 mmol/L 17   Calcium 8.9 - 10.3 mg/dL 9.1   Total Protein 6.5 - 8.1 g/dL 6.5   Total Bilirubin 0.0 - 1.2 mg/dL 0.5   Alkaline Phos 38 - 126 U/L 136   AST 15 - 41 U/L 17   ALT 0 - 44 U/L 7    Edinburgh Score:    02/04/2024    8:30 AM  Edinburgh Postnatal Depression Scale Screening Tool  I have been able to laugh and see the funny side of things. 0  I have looked forward with enjoyment to things. 0  I have blamed myself unnecessarily when things went wrong. 2  I have been anxious or worried for no good reason. 2  I have felt scared or panicky for no good reason. 0  Things have been getting on top of me. 2  I have been so unhappy that I have had difficulty sleeping. 2  I have felt sad or miserable. 1  I have been so unhappy that I have been crying. 1  The thought of harming myself has occurred to me. 0  Edinburgh Postnatal Depression Scale Total 10      After visit meds:  Allergies as of 02/04/2024   No Known Allergies   Med Rec must be completed prior to using this [facility]***        Discharge home in stable condition Infant Feeding: {Baby feeding:23562} Infant Disposition:{CHL IP OB HOME WITH FNUYZM:76418} Discharge instruction: per After Visit Summary and Postpartum booklet. Activity: Advance as tolerated. Pelvic rest for 6 weeks.  Diet: {OB  diet:21111121} Anticipated Birth Control: {Birth Control:23956} Postpartum Appointment:{Outpatient follow up:23559} Additional Postpartum F/U: {PP Procedure:23957} Future Appointments: Future Appointments  Date Time Provider Department Center  02/12/2024  3:40 PM [facility] NURSE [facility]   Follow up Visit:  [X]  message sent on 12/21 for a 1wk BP and incision check     02/04/2024 Rayder Sullenger  Claudene, CNM   "

## 2024-02-04 NOTE — Progress Notes (Addendum)
 CSW received consult for hx of marijuana use.  Referral was screened out due to the following:  ~MOB had no documented substance use after initial prenatal visit/+UPT. ~MOB had no positive drug screens after initial prenatal visit/+UPT. ~Baby's UDS is negative.  Please consult CSW if current concerns arise or by MOB's request.  CSW will monitor CDS results and make report to Child Protective Services if warranted.  CSW received consult due to score 10 on Edinburgh Depression Screen. CSW met MOB at bedside to complete an assessment and offer support. CSW entered the room, introduced herself and acknowledged her family was present. CSW asked MOB for privacy reasons could her family stepout for the assessment; MOB was agreeable and her guest stepped out. CSW explained her role and the reason for the visit. MOB presented laying in bed as the infant was sleeping in the bassinet. MOB was polite, easy to engage, receptive to meeting with CSW, and appeared forthcoming.  CSW acknowledged Edinburgh score of 10 and listened to MOB explore her feelings about transitioning into motherhood. MOB reported worries about being present for all of her kids/juggling her time and finishing school with a newborn. MOB reported and was able to rationalize her thoughts with positivity. MOB reported during pregnancy she was able to handle her school requirements and feels comfortable with handling the need for her school assignments. Patient requests a referral to Integrated Behavioral Health. Patient verbalizes understanding that the appointment will be virtual. CSW inquired about MOB's mental health history. MOB denied hx of mental health. CSW asked MOB during pregnancy she mentioned being/feeling lonely during one of her OB visits. MOB reported at night she feels episodes of loneliness;  however she has a good support system. MOB reported her support system as FOB, her mom and sister. CSW provided education regarding Baby  Blues vs PMADs and provided MOB with resources for mental health follow up.  CSW encouraged MOB to evaluate her mental health throughout the postpartum period with the use of the New Mom Checklist developed by Postpartum Progress as well as the Edinburgh Postnatal Depression Scale and notify a medical professional if symptoms arise. CSW assessed for safety with MOB SI/HI/DV;MOB denied all.  CSW asked MOB has she selected a pediatrician for the infant's follow up visits; MOB said [facility]. MOB reported having all essential items for the infant including a carseat, bassinet and crib for safe sleeping. CSW provided review of Sudden Infant Death Syndrome (SIDS) precautions.  CSW identifies no further need for intervention and no barriers to discharge at this time.  Rosina Molt, [place] Clinical Social Worker [phone]

## 2024-02-04 NOTE — Progress Notes (Signed)
 Rosina BROCKS Shaneyfelt       02/04/24        12:10 AM   Date/Time BP  02/03/24 2300 137/68  02/03/24 2144 140/96 Abnormal    RN called concerned about PT recent blood pressure. Meets criteria for gHTN. Procardia  ordered.  Conard Me, SNM, RNC-OB Student Nurse Midwife 02/04/2024 12:21 AM

## 2024-02-04 NOTE — Progress Notes (Signed)
 Attempted to set mother up with Babyscripts app.  She has the app downloaded on her phone, but her phone is broken.  We tried to download the app on her tablet.  It would not download, she kept getting error messages.  CNM was in the room at this time and heard the patient telling me it wouldn't download.  I instructed the patient to take her blood pressure twice daily and document for her doctors.  Per [place]  Claudene, CNM call MD if blood pressures >/= 140/90.

## 2024-02-05 ENCOUNTER — Encounter (HOSPITAL_COMMUNITY): Payer: Self-pay | Admitting: Obstetrics and Gynecology

## 2024-02-07 ENCOUNTER — Encounter

## 2024-02-07 ENCOUNTER — Telehealth (HOSPITAL_COMMUNITY): Payer: Self-pay | Admitting: *Deleted

## 2024-02-07 DIAGNOSIS — Z1331 Encounter for screening for depression: Secondary | ICD-10-CM

## 2024-02-07 NOTE — Telephone Encounter (Signed)
 Patient's EPDS score in the hospital = 10. Answer to question ten = 0-Never. Per CSW note dated 02/04/2024 patient requests [facility] referral. Order placed for [facility] referral. Dr. Cleatus notified via [facility] order.  Mliss Sieve, RN 02/07/2024 10:50

## 2024-02-12 ENCOUNTER — Ambulatory Visit (INDEPENDENT_AMBULATORY_CARE_PROVIDER_SITE_OTHER): Payer: Self-pay

## 2024-02-12 ENCOUNTER — Other Ambulatory Visit: Payer: Self-pay

## 2024-02-12 VITALS — BP 121/63 | HR 94 | Ht 67.0 in | Wt 215.7 lb

## 2024-02-12 DIAGNOSIS — Z0130 Encounter for examination of blood pressure without abnormal findings: Secondary | ICD-10-CM

## 2024-02-12 DIAGNOSIS — Z98891 History of uterine scar from previous surgery: Secondary | ICD-10-CM

## 2024-02-12 NOTE — Progress Notes (Signed)
 Subjective:  Lisa Byrd is a [id number] here for BP check and incision check.  She is 1 week postpartum following a repeat cesarean section. Patient completed 5 day course of lasix  and potassium.  BP 121/63 Patient is taking Nifedipine  30 mg as prescribed.   Hypertension ROS: Patient denies headache, visual changes, abdominal pain, and peripheral edema.    Objective:  BP 121/63   Pulse 94   Ht 5' 7 (1.702 m)   Wt 215 lb 11.2 oz (97.8 kg)   BMI 33.78 kg/m   Appearance alert, well appearing, and in no distress.  Assessment:   Blood Pressure well controlled.  Incision appears well approximated, no redness, swelling or pain at the incision site.   Plan:  Patient needs postpartum visit scheduled. MAU precautions reviewed patient verbalized understanding. Reviewed signs and symptoms of infection; patient verbalized understanding. Reviewed daily wound care ;patient voiced understanding. Advised patient to continue provider recommendations and call the office with any questions or concerns. Patient states no further questions or concerns.    [place]on, RN 02/12/24

## 2024-02-14 ENCOUNTER — Encounter: Admitting: Advanced Practice Midwife

## 2024-02-14 ENCOUNTER — Telehealth: Payer: Self-pay | Admitting: Clinical

## 2024-02-14 NOTE — Telephone Encounter (Signed)
Attempt call regarding referral; Unable to leave voice message.   

## 2024-03-05 ENCOUNTER — Other Ambulatory Visit (HOSPITAL_COMMUNITY)
Admission: RE | Admit: 2024-03-05 | Discharge: 2024-03-05 | Disposition: A | Source: Ambulatory Visit | Attending: Student | Admitting: Student

## 2024-03-05 ENCOUNTER — Ambulatory Visit: Payer: Self-pay | Admitting: Student

## 2024-03-05 ENCOUNTER — Encounter: Payer: Self-pay | Admitting: Student

## 2024-03-05 ENCOUNTER — Other Ambulatory Visit: Payer: Self-pay

## 2024-03-05 DIAGNOSIS — Z30017 Encounter for initial prescription of implantable subdermal contraceptive: Secondary | ICD-10-CM | POA: Diagnosis not present

## 2024-03-05 DIAGNOSIS — Z124 Encounter for screening for malignant neoplasm of cervix: Secondary | ICD-10-CM

## 2024-03-05 LAB — POCT PREGNANCY, URINE: Preg Test, Ur: NEGATIVE

## 2024-03-05 MED ORDER — ETONOGESTREL 68 MG ~~LOC~~ IMPL
68.0000 mg | DRUG_IMPLANT | Freq: Once | SUBCUTANEOUS | Status: AC
  Administered 2024-03-05: 68 mg via SUBCUTANEOUS

## 2024-03-05 NOTE — Progress Notes (Signed)
" ° ° ° °  GYNECOLOGY OFFICE PROCEDURE NOTE  Lisa Byrd is a 35 y.o. [phone] here for Nexplanon  insertion.    Last pap smear: Result Date Procedure Results Follow-ups  06/14/2022 Cytology - PAP High risk HPV: Negative Adequacy: Satisfactory for evaluation; transformation zone component ABSENT. Diagnosis: - Negative for intraepithelial lesion or malignancy (NILM) Microorganisms: Fungal organisms present consistent with Candida spp. 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 Diagnosis: - Atypical squamous cells of undetermined significance (ASC-US ) (A) Comment: Normal Reference Range HPV - Negative   01/15/2017 Cytology - PAP Adequacy: Satisfactory for evaluation  endocervical/transformation zone component PRESENT. Diagnosis: NEGATIVE FOR INTRAEPITHELIAL LESIONS OR MALIGNANCY. Bacterial vaginitis: POSITIVE for Gardnerella vaginalis (A) Candida vaginitis: Negative for Candida species Chlamydia: Negative Neisseria Gonorrhea: POSITIVE (A) Trichomonas: Negative Material Submitted: CervicoVaginal Pap [ThinPrep Imaged]   06/16/2012 Cytology - PAP       Nexplanon  insertion Procedure Patient identified, informed consent performed, consent signed.   Patient does understand that irregular bleeding is a very common side effect of this medication. She was advised to have backup contraception for at least one week after placement. Pregnancy test in clinic today was negative .  Appropriate time out taken.  Patient's left arm was prepped and draped in the usual sterile fashion. Patient was prepped with alcohol swab and then injected with 3 ml of 1% lidocaine .  She was prepped with betadine, Nexplanon  removed from packaging,  Device confirmed in needle, then inserted full length of needle and  withdrawn per handbook instructions. Nexplanon  was able to palpated in the patient's arm; patient palpated the insert herself. There was minimal blood loss.  Patient insertion site covered with guaze and a pressure bandage to reduce any bruising.  The patient tolerated the procedure well and was given post procedure instructions.   Rocky Satterfield, NP 03/05/2024   "

## 2024-03-05 NOTE — Progress Notes (Signed)
 Pt wants Nexplanon

## 2024-03-05 NOTE — Progress Notes (Signed)
 "   Post Partum Visit Note  Lisa Byrd is a 34 y.o. G1P3003 female who presents for a postpartum visit. She is 4 weeks postpartum following a repeat cesarean section.  I have fully reviewed the prenatal and intrapartum course. The delivery was at 39/1 gestational weeks.  Anesthesia: spinal. Postpartum course has been normal. Baby is doing well. Baby is feeding by bottle - Similac Advance and Similac Sensitive RS. Bleeding no bleeding. Bowel function is normal. Bladder function is normal. Patient is not sexually active. Contraception method is none. Postpartum depression screening: negative.   The pregnancy intention screening data noted above was reviewed. Potential methods of contraception were discussed. The patient elected to proceed with No data recorded.   Edinburgh Postnatal Depression Scale - 03/05/24 1459       Edinburgh Postnatal Depression Scale:  In the Past 7 Days   I have been able to laugh and see the funny side of things. 0    I have looked forward with enjoyment to things. 0    I have blamed myself unnecessarily when things went wrong. 0    I have felt scared or panicky for no good reason. 0    Things have been getting on top of me. 0    I have been so unhappy that I have had difficulty sleeping. 0    I have felt sad or miserable. 0    I have been so unhappy that I have been crying. 0    The thought of harming myself has occurred to me. 0          Health Maintenance Due  Topic Date Due   Pneumococcal Vaccine (1 of 2 - PCV) Never done   Hepatitis B Vaccines 19-59 Average Risk (1 of 3 - 19+ 3-dose series) Never done   COVID-19 Vaccine (1 - 2025-26 season) Never done    The following portions of the patient's history were reviewed and updated as appropriate: allergies, current medications, past family history, past medical history, past social history, past surgical history, and problem list.  Review of Systems Pertinent items are noted in HPI.  Objective:  BP  135/88   Pulse 80   Ht 5' 7 (1.702 m)   Wt 221 lb 1.6 oz (100.3 kg)   Breastfeeding No   BMI 34.63 kg/m    General:  alert, cooperative, and appears stated age   Breasts:  normal  Lungs: clear to auscultation bilaterally  Heart:  regular rate and rhythm, S1, S2 normal, no murmur, click, rub or gallop  Abdomen: soft, non-tender; bowel sounds normal; no masses,  no organomegaly   Wound well approximated incision  GU exam:  Normal vulva. Scant dark red blood. Pap smear collected.        Assessment:    There are no diagnoses linked to this encounter.  normal postpartum exam.   Plan:   Essential components of care per ACOG recommendations:  1.  Mood and well being: Patient with negative depression screening today. Reviewed local resources for support.  - Patient tobacco use? Yes. Patient desires to quit? No.   - hx of drug use? No.    2. Infant care and feeding:  -Patient currently breastmilk feeding? No.  -Social determinants of health (SDOH) reviewed in EPIC. No concerns  3. Sexuality, contraception and birth spacing - Patient does not want a pregnancy in the next year.  Desired family size is 3 children.  - Reviewed reproductive life planning. Reviewed contraceptive  methods based on pt preferences and effectiveness.  Patient desired Hormonal Implant today.   - Discussed birth spacing of 18 months  4. Sleep and fatigue -Encouraged family/partner/community support of 4 hrs of uninterrupted sleep to help with mood and fatigue  5. Physical Recovery  - Discussed patients delivery and complications. She describes her labor as good. - Patient had a C-section repeat; no problems after deliver. - Patient has urinary incontinence? No. - Patient is safe to resume physical and sexual activity  6.  Health Maintenance - HM due items addressed Yes - Last pap smear  Diagnosis  Date Value Ref Range Status  06/14/2022   Final   - Negative for intraepithelial lesion or malignancy  (NILM)   Pap smear done at today's visit due to hx of abnormal pap in 2022.  -Breast Cancer screening indicated? No.   7. Chronic Disease/Pregnancy Condition follow up: None  - PCP follow up  Rocky Satterfield, NP [facility] [employer]  "

## 2024-03-10 LAB — CYTOLOGY - PAP
Adequacy: ABSENT
Comment: NEGATIVE
Comment: NEGATIVE
Comment: NEGATIVE
Diagnosis: UNDETERMINED — AB
HPV 16: NEGATIVE
HPV 18 / 45: NEGATIVE
High risk HPV: POSITIVE — AB

## 2024-03-11 ENCOUNTER — Ambulatory Visit (HOSPITAL_COMMUNITY): Payer: Self-pay | Admitting: Student

## 2024-03-11 NOTE — Telephone Encounter (Signed)
 Attempted to contact patient - no answer & unable to leave voicemail.  Patient needs to be scheduled for colposcopy based on abnormal pap smear. Mychart message sent to patient & message sent to office for scheduling.   Lisa Byrd

## 2024-03-13 ENCOUNTER — Other Ambulatory Visit: Payer: Self-pay | Admitting: Student

## 2024-03-13 MED ORDER — NICOTINE 21 MG/24HR TD PT24: 21.0000 mg | MEDICATED_PATCH | Freq: Every day | TRANSDERMAL | 0 refills | Status: AC

## 2024-03-13 MED ORDER — NICOTINE 7 MG/24HR TD PT24: 7.0000 mg | MEDICATED_PATCH | Freq: Every day | TRANSDERMAL | 0 refills | Status: AC

## 2024-03-13 MED ORDER — NICOTINE 14 MG/24HR TD PT24: 14.0000 mg | MEDICATED_PATCH | Freq: Every day | TRANSDERMAL | 0 refills | Status: AC

## 2024-04-23 ENCOUNTER — Ambulatory Visit: Payer: Self-pay | Admitting: Obstetrics and Gynecology
